# Patient Record
Sex: Female | Born: 1935 | Race: White | Hispanic: No | Marital: Married | Smoking: Former smoker
Health system: Southern US, Community
[De-identification: ages and names within clinical notes are randomized; demographics above are authoritative.]

## PROBLEM LIST (undated history)

## (undated) DIAGNOSIS — J439 Emphysema, unspecified: Secondary | ICD-10-CM

## (undated) DIAGNOSIS — D649 Anemia, unspecified: Secondary | ICD-10-CM

## (undated) DIAGNOSIS — I10 Essential (primary) hypertension: Secondary | ICD-10-CM

## (undated) DIAGNOSIS — I2510 Atherosclerotic heart disease of native coronary artery without angina pectoris: Secondary | ICD-10-CM

## (undated) DIAGNOSIS — K7460 Unspecified cirrhosis of liver: Secondary | ICD-10-CM

## (undated) DIAGNOSIS — I739 Peripheral vascular disease, unspecified: Secondary | ICD-10-CM

## (undated) DIAGNOSIS — H4430 Unspecified degenerative disorder of globe: Secondary | ICD-10-CM

## (undated) DIAGNOSIS — J449 Chronic obstructive pulmonary disease, unspecified: Secondary | ICD-10-CM

## (undated) DIAGNOSIS — H5440 Blindness, one eye, unspecified eye: Secondary | ICD-10-CM

## (undated) HISTORY — PX: OTHER SURGICAL HISTORY: SHX169

## (undated) HISTORY — PX: VASCULAR SURGERY: SHX849

## (undated) HISTORY — PX: KNEE ARTHROSCOPY: SUR90

## (undated) HISTORY — PX: CORONARY STENT PLACEMENT: SHX1402

## (undated) HISTORY — PX: SHOULDER SURGERY: SHX246

---

## 2004-02-08 ENCOUNTER — Emergency Department: Payer: Self-pay | Admitting: Emergency Medicine

## 2004-08-16 ENCOUNTER — Emergency Department: Payer: Self-pay | Admitting: Unknown Physician Specialty

## 2004-08-16 ENCOUNTER — Other Ambulatory Visit: Payer: Self-pay

## 2005-05-15 ENCOUNTER — Ambulatory Visit: Payer: Self-pay | Admitting: Internal Medicine

## 2005-05-30 ENCOUNTER — Other Ambulatory Visit: Payer: Self-pay

## 2005-05-30 ENCOUNTER — Emergency Department: Payer: Self-pay | Admitting: Emergency Medicine

## 2005-07-20 ENCOUNTER — Inpatient Hospital Stay: Payer: Self-pay | Admitting: Internal Medicine

## 2005-07-20 ENCOUNTER — Other Ambulatory Visit: Payer: Self-pay

## 2005-07-25 ENCOUNTER — Other Ambulatory Visit: Payer: Self-pay

## 2005-08-04 ENCOUNTER — Ambulatory Visit: Payer: Self-pay | Admitting: Internal Medicine

## 2005-08-15 ENCOUNTER — Ambulatory Visit: Payer: Self-pay | Admitting: Internal Medicine

## 2005-08-29 ENCOUNTER — Ambulatory Visit: Payer: Self-pay | Admitting: Orthopedic Surgery

## 2005-09-17 ENCOUNTER — Other Ambulatory Visit: Payer: Self-pay

## 2005-09-17 ENCOUNTER — Emergency Department: Payer: Self-pay | Admitting: Emergency Medicine

## 2006-03-13 ENCOUNTER — Ambulatory Visit: Payer: Self-pay

## 2007-01-30 ENCOUNTER — Emergency Department: Payer: Self-pay | Admitting: Emergency Medicine

## 2007-01-30 ENCOUNTER — Other Ambulatory Visit: Payer: Self-pay

## 2007-04-06 ENCOUNTER — Other Ambulatory Visit: Payer: Self-pay

## 2007-04-06 ENCOUNTER — Inpatient Hospital Stay: Payer: Self-pay | Admitting: Internal Medicine

## 2007-05-05 ENCOUNTER — Ambulatory Visit: Payer: Self-pay | Admitting: Internal Medicine

## 2007-06-04 ENCOUNTER — Ambulatory Visit: Payer: Self-pay | Admitting: Internal Medicine

## 2007-07-05 ENCOUNTER — Ambulatory Visit: Payer: Self-pay | Admitting: Internal Medicine

## 2007-08-03 ENCOUNTER — Ambulatory Visit: Payer: Self-pay | Admitting: Internal Medicine

## 2007-08-04 ENCOUNTER — Ambulatory Visit: Payer: Self-pay | Admitting: Internal Medicine

## 2007-09-14 ENCOUNTER — Ambulatory Visit: Payer: Self-pay | Admitting: Internal Medicine

## 2007-10-05 ENCOUNTER — Ambulatory Visit: Payer: Self-pay | Admitting: Internal Medicine

## 2007-11-04 ENCOUNTER — Ambulatory Visit: Payer: Self-pay | Admitting: Internal Medicine

## 2008-03-07 ENCOUNTER — Ambulatory Visit: Payer: Self-pay | Admitting: Internal Medicine

## 2008-07-04 ENCOUNTER — Ambulatory Visit: Payer: Self-pay | Admitting: Specialist

## 2008-09-06 ENCOUNTER — Ambulatory Visit: Payer: Self-pay | Admitting: Internal Medicine

## 2009-04-25 ENCOUNTER — Emergency Department: Payer: Self-pay | Admitting: Emergency Medicine

## 2009-07-31 ENCOUNTER — Ambulatory Visit: Payer: Self-pay | Admitting: Rheumatology

## 2010-05-28 ENCOUNTER — Ambulatory Visit: Payer: Self-pay | Admitting: General Surgery

## 2010-09-24 ENCOUNTER — Ambulatory Visit: Payer: Self-pay | Admitting: Internal Medicine

## 2010-12-31 ENCOUNTER — Emergency Department: Payer: Self-pay | Admitting: Emergency Medicine

## 2011-01-14 ENCOUNTER — Ambulatory Visit: Payer: Self-pay | Admitting: General Surgery

## 2011-08-04 ENCOUNTER — Ambulatory Visit: Payer: Self-pay | Admitting: Internal Medicine

## 2011-08-30 ENCOUNTER — Inpatient Hospital Stay: Payer: Self-pay | Admitting: Internal Medicine

## 2011-08-30 LAB — URINALYSIS, COMPLETE
Bilirubin,UR: NEGATIVE
Hyaline Cast: 9
Ketone: NEGATIVE
Nitrite: NEGATIVE
Ph: 5 (ref 4.5–8.0)
Protein: NEGATIVE
RBC,UR: NONE SEEN /HPF (ref 0–5)
Specific Gravity: 1.008 (ref 1.003–1.030)
Squamous Epithelial: NONE SEEN
WBC UR: NONE SEEN /HPF (ref 0–5)

## 2011-08-30 LAB — COMPREHENSIVE METABOLIC PANEL
Alkaline Phosphatase: 106 U/L (ref 50–136)
Anion Gap: 5 — ABNORMAL LOW (ref 7–16)
Bilirubin,Total: 0.3 mg/dL (ref 0.2–1.0)
Calcium, Total: 9.5 mg/dL (ref 8.5–10.1)
Chloride: 91 mmol/L — ABNORMAL LOW (ref 98–107)
Co2: 39 mmol/L — ABNORMAL HIGH (ref 21–32)
Creatinine: 1 mg/dL (ref 0.60–1.30)
EGFR (African American): >60
EGFR (Non-African Amer.): 53 — ABNORMAL LOW
Glucose: 131 mg/dL — ABNORMAL HIGH (ref 65–99)
Osmolality: 275 (ref 275–301)
SGPT (ALT): 15 U/L
Sodium: 135 mmol/L — ABNORMAL LOW (ref 136–145)

## 2011-08-30 LAB — CBC WITH DIFFERENTIAL/PLATELET
Basophil %: 0.8 %
Eosinophil %: 2 %
HGB: 8.6 g/dL — ABNORMAL LOW (ref 12.0–16.0)
Lymphocyte #: 0.6 10*3/uL — ABNORMAL LOW (ref 1.0–3.6)
MCH: 29.3 pg (ref 26.0–34.0)
Monocyte #: 1 x10 3/mm — ABNORMAL HIGH (ref 0.2–0.9)
Monocyte %: 12.3 %
Neutrophil #: 6 10*3/uL (ref 1.4–6.5)
RBC: 2.94 10*6/uL — ABNORMAL LOW (ref 3.80–5.20)
WBC: 7.8 10*3/uL (ref 3.6–11.0)

## 2011-08-30 LAB — PRO B NATRIURETIC PEPTIDE: B-Type Natriuretic Peptide: 134 pg/mL (ref 0–450)

## 2011-08-30 LAB — TSH: Thyroid Stimulating Horm: 1.6 u[IU]/mL

## 2011-08-31 LAB — CBC WITH DIFFERENTIAL/PLATELET
Eosinophil #: 0 10*3/uL (ref 0.0–0.7)
Eosinophil %: 0.1 %
HCT: 26.8 % — ABNORMAL LOW (ref 35.0–47.0)
Lymphocyte #: 0.3 10*3/uL — ABNORMAL LOW (ref 1.0–3.6)
MCH: 29.9 pg (ref 26.0–34.0)
MCV: 93 fL (ref 80–100)
Monocyte #: 0.1 x10 3/mm — ABNORMAL LOW (ref 0.2–0.9)
Neutrophil #: 7.8 10*3/uL — ABNORMAL HIGH (ref 1.4–6.5)
Platelet: 214 10*3/uL (ref 150–440)
RBC: 2.89 10*6/uL — ABNORMAL LOW (ref 3.80–5.20)
RDW: 14.7 % — ABNORMAL HIGH (ref 11.5–14.5)

## 2011-08-31 LAB — BASIC METABOLIC PANEL
BUN: 19 mg/dL — ABNORMAL HIGH (ref 7–18)
Creatinine: 0.93 mg/dL (ref 0.60–1.30)
EGFR (Non-African Amer.): 58 — ABNORMAL LOW
Glucose: 215 mg/dL — ABNORMAL HIGH (ref 65–99)
Potassium: 3.7 mmol/L (ref 3.5–5.1)
Sodium: 136 mmol/L (ref 136–145)

## 2011-08-31 LAB — CK TOTAL AND CKMB (NOT AT ARMC)
CK, Total: 25 U/L (ref 21–215)
CK, Total: 22 U/L (ref 21–215)
CK-MB: 1.2 ng/mL (ref 0.5–3.6)

## 2011-09-01 LAB — CBC WITH DIFFERENTIAL/PLATELET
Basophil #: 0 10*3/uL (ref 0.0–0.1)
Basophil %: 0.1 %
Eosinophil #: 0 10*3/uL (ref 0.0–0.7)
Eosinophil %: 0 %
HCT: 25.6 % — ABNORMAL LOW (ref 35.0–47.0)
HGB: 8.1 g/dL — ABNORMAL LOW (ref 12.0–16.0)
Lymphocyte #: 0.5 10*3/uL — ABNORMAL LOW (ref 1.0–3.6)
MCH: 28.9 pg (ref 26.0–34.0)
MCV: 91 fL (ref 80–100)
Monocyte %: 2.8 %
Neutrophil %: 93.3 %
Platelet: 238 10*3/uL (ref 150–440)
RBC: 2.81 10*6/uL — ABNORMAL LOW (ref 3.80–5.20)
WBC: 13.4 10*3/uL — ABNORMAL HIGH (ref 3.6–11.0)

## 2011-09-01 LAB — BASIC METABOLIC PANEL
Anion Gap: 8 (ref 7–16)
BUN: 29 mg/dL — ABNORMAL HIGH (ref 7–18)
Creatinine: 0.97 mg/dL (ref 0.60–1.30)
EGFR (African American): >60
EGFR (Non-African Amer.): 55 — ABNORMAL LOW
Glucose: 143 mg/dL — ABNORMAL HIGH (ref 65–99)
Sodium: 136 mmol/L (ref 136–145)

## 2011-09-02 LAB — BASIC METABOLIC PANEL
Anion Gap: 9 (ref 7–16)
Chloride: 91 mmol/L — ABNORMAL LOW (ref 98–107)
Co2: 36 mmol/L — ABNORMAL HIGH (ref 21–32)
Creatinine: 1.03 mg/dL (ref 0.60–1.30)
Glucose: 139 mg/dL — ABNORMAL HIGH (ref 65–99)
Potassium: 4 mmol/L (ref 3.5–5.1)
Sodium: 136 mmol/L (ref 136–145)

## 2011-09-02 LAB — CBC WITH DIFFERENTIAL/PLATELET
Basophil #: 0 10*3/uL (ref 0.0–0.1)
Eosinophil #: 0 10*3/uL (ref 0.0–0.7)
HGB: 8.2 g/dL — ABNORMAL LOW (ref 12.0–16.0)
Lymphocyte #: 0.6 10*3/uL — ABNORMAL LOW (ref 1.0–3.6)
MCH: 28.8 pg (ref 26.0–34.0)
MCHC: 32 g/dL (ref 32.0–36.0)
MCV: 90 fL (ref 80–100)
Monocyte #: 0.5 x10 3/mm (ref 0.2–0.9)
Monocyte %: 4.3 %
Neutrophil %: 90.5 %
Platelet: 229 10*3/uL (ref 150–440)
RBC: 2.83 10*6/uL — ABNORMAL LOW (ref 3.80–5.20)
RDW: 14.4 % (ref 11.5–14.5)

## 2011-09-04 ENCOUNTER — Ambulatory Visit: Payer: Self-pay | Admitting: Internal Medicine

## 2013-09-12 ENCOUNTER — Ambulatory Visit: Payer: Self-pay | Admitting: Gastroenterology

## 2013-10-07 ENCOUNTER — Ambulatory Visit: Payer: Self-pay | Admitting: Gastroenterology

## 2013-10-13 ENCOUNTER — Ambulatory Visit: Payer: Self-pay | Admitting: Gastroenterology

## 2013-11-08 ENCOUNTER — Inpatient Hospital Stay: Payer: Self-pay | Admitting: Internal Medicine

## 2013-11-08 LAB — AMMONIA: Ammonia, Plasma: 14 mcmol/L (ref 11–32)

## 2013-11-08 LAB — URINALYSIS, COMPLETE
BLOOD: NEGATIVE
Bacteria: NONE SEEN
Bilirubin,UR: NEGATIVE
GLUCOSE, UR: NEGATIVE mg/dL (ref 0–75)
Hyaline Cast: 16
Ketone: NEGATIVE
Nitrite: NEGATIVE
PH: 5 (ref 4.5–8.0)
Protein: NEGATIVE
RBC,UR: 5 /HPF (ref 0–5)
Specific Gravity: 1.011 (ref 1.003–1.030)
Squamous Epithelial: 3
WBC UR: 50 /HPF (ref 0–5)

## 2013-11-08 LAB — COMPREHENSIVE METABOLIC PANEL
ALK PHOS: 59 U/L
ALT: 13 U/L — AB
ANION GAP: 4 — AB (ref 7–16)
Albumin: 3.4 g/dL (ref 3.4–5.0)
BILIRUBIN TOTAL: 0.3 mg/dL (ref 0.2–1.0)
BUN: 19 mg/dL — AB (ref 7–18)
CALCIUM: 9.2 mg/dL (ref 8.5–10.1)
Chloride: 93 mmol/L — ABNORMAL LOW (ref 98–107)
Co2: 42 mmol/L (ref 21–32)
Creatinine: 1.05 mg/dL (ref 0.60–1.30)
EGFR (African American): >60
EGFR (Non-African Amer.): 53 — ABNORMAL LOW
Glucose: 143 mg/dL — ABNORMAL HIGH (ref 65–99)
OSMOLALITY: 282 (ref 275–301)
POTASSIUM: 4.4 mmol/L (ref 3.5–5.1)
SGOT(AST): 25 U/L (ref 15–37)
SODIUM: 139 mmol/L (ref 136–145)
TOTAL PROTEIN: 7.1 g/dL (ref 6.4–8.2)

## 2013-11-08 LAB — CBC
HCT: 31 % — ABNORMAL LOW (ref 35.0–47.0)
HGB: 9.6 g/dL — AB (ref 12.0–16.0)
MCH: 30.7 pg (ref 26.0–34.0)
MCHC: 30.8 g/dL — AB (ref 32.0–36.0)
MCV: 100 fL (ref 80–100)
Platelet: 170 10*3/uL (ref 150–440)
RBC: 3.11 10*6/uL — AB (ref 3.80–5.20)
RDW: 15 % — AB (ref 11.5–14.5)
WBC: 10.5 10*3/uL (ref 3.6–11.0)

## 2013-11-08 LAB — TROPONIN I: Troponin-I: <0.02 ng/mL

## 2013-11-08 LAB — HEMOGLOBIN A1C: Hemoglobin A1C: <3.5 % — ABNORMAL LOW (ref 4.2–6.3)

## 2013-11-10 LAB — CBC WITH DIFFERENTIAL/PLATELET
Basophil #: 0 10*3/uL (ref 0.0–0.1)
Basophil %: 0.1 %
EOS ABS: 0 10*3/uL (ref 0.0–0.7)
Eosinophil %: 0 %
HCT: 30 % — ABNORMAL LOW (ref 35.0–47.0)
HGB: 9.4 g/dL — ABNORMAL LOW (ref 12.0–16.0)
Lymphocyte #: 0.4 10*3/uL — ABNORMAL LOW (ref 1.0–3.6)
Lymphocyte %: 2.8 %
MCH: 30.6 pg (ref 26.0–34.0)
MCHC: 31.3 g/dL — AB (ref 32.0–36.0)
MCV: 98 fL (ref 80–100)
MONOS PCT: 1.4 %
Monocyte #: 0.2 x10 3/mm (ref 0.2–0.9)
Neutrophil #: 12.4 10*3/uL — ABNORMAL HIGH (ref 1.4–6.5)
Neutrophil %: 95.7 %
Platelet: 119 10*3/uL — ABNORMAL LOW (ref 150–440)
RBC: 3.07 10*6/uL — AB (ref 3.80–5.20)
RDW: 14.5 % (ref 11.5–14.5)
WBC: 13 10*3/uL — AB (ref 3.6–11.0)

## 2013-11-10 LAB — BASIC METABOLIC PANEL
Anion Gap: 3 — ABNORMAL LOW (ref 7–16)
BUN: 33 mg/dL — AB (ref 7–18)
CALCIUM: 9 mg/dL (ref 8.5–10.1)
CHLORIDE: 93 mmol/L — AB (ref 98–107)
CO2: 42 mmol/L — AB (ref 21–32)
CREATININE: 1.28 mg/dL (ref 0.60–1.30)
EGFR (African American): 51 — ABNORMAL LOW
EGFR (Non-African Amer.): 42 — ABNORMAL LOW
Glucose: 160 mg/dL — ABNORMAL HIGH (ref 65–99)
Osmolality: 286 (ref 275–301)
POTASSIUM: 3.8 mmol/L (ref 3.5–5.1)
Sodium: 138 mmol/L (ref 136–145)

## 2013-11-11 LAB — BASIC METABOLIC PANEL
ANION GAP: 4 — AB (ref 7–16)
BUN: 39 mg/dL — ABNORMAL HIGH (ref 7–18)
CALCIUM: 9.1 mg/dL (ref 8.5–10.1)
CHLORIDE: 95 mmol/L — AB (ref 98–107)
CO2: 43 mmol/L — AB (ref 21–32)
CREATININE: 1.16 mg/dL (ref 0.60–1.30)
EGFR (Non-African Amer.): 47 — ABNORMAL LOW
GFR CALC AF AMER: 57 — AB
Glucose: 123 mg/dL — ABNORMAL HIGH (ref 65–99)
OSMOLALITY: 294 (ref 275–301)
POTASSIUM: 3.5 mmol/L (ref 3.5–5.1)
SODIUM: 142 mmol/L (ref 136–145)

## 2013-11-11 LAB — CBC WITH DIFFERENTIAL/PLATELET
Basophil #: 0 10*3/uL (ref 0.0–0.1)
Basophil %: 0 %
Eosinophil #: 0 10*3/uL (ref 0.0–0.7)
Eosinophil %: 0 %
HCT: 29.6 % — ABNORMAL LOW (ref 35.0–47.0)
HGB: 9.4 g/dL — AB (ref 12.0–16.0)
Lymphocyte #: 0.5 10*3/uL — ABNORMAL LOW (ref 1.0–3.6)
Lymphocyte %: 5.2 %
MCH: 30.9 pg (ref 26.0–34.0)
MCHC: 31.6 g/dL — ABNORMAL LOW (ref 32.0–36.0)
MCV: 98 fL (ref 80–100)
MONOS PCT: 6.3 %
Monocyte #: 0.6 x10 3/mm (ref 0.2–0.9)
Neutrophil #: 8.4 10*3/uL — ABNORMAL HIGH (ref 1.4–6.5)
Neutrophil %: 88.5 %
Platelet: 104 10*3/uL — ABNORMAL LOW (ref 150–440)
RBC: 3.03 10*6/uL — ABNORMAL LOW (ref 3.80–5.20)
RDW: 14 % (ref 11.5–14.5)
WBC: 9.5 10*3/uL (ref 3.6–11.0)

## 2013-11-11 LAB — AMMONIA: Ammonia, Plasma: <10 mcmol/L (ref 11–32)

## 2013-11-12 LAB — CBC WITH DIFFERENTIAL/PLATELET
Basophil #: 0 10*3/uL (ref 0.0–0.1)
Basophil %: 0.1 %
EOS ABS: 0 10*3/uL (ref 0.0–0.7)
Eosinophil %: 0.5 %
HCT: 29.5 % — AB (ref 35.0–47.0)
HGB: 9.2 g/dL — AB (ref 12.0–16.0)
LYMPHS PCT: 12.5 %
Lymphocyte #: 0.9 10*3/uL — ABNORMAL LOW (ref 1.0–3.6)
MCH: 30.8 pg (ref 26.0–34.0)
MCHC: 31.2 g/dL — ABNORMAL LOW (ref 32.0–36.0)
MCV: 99 fL (ref 80–100)
MONO ABS: 1 x10 3/mm — AB (ref 0.2–0.9)
Monocyte %: 13.7 %
NEUTROS ABS: 5.3 10*3/uL (ref 1.4–6.5)
Neutrophil %: 73.2 %
Platelet: 101 10*3/uL — ABNORMAL LOW (ref 150–440)
RBC: 2.99 10*6/uL — ABNORMAL LOW (ref 3.80–5.20)
RDW: 14.3 % (ref 11.5–14.5)
WBC: 7.3 10*3/uL (ref 3.6–11.0)

## 2013-11-12 LAB — BASIC METABOLIC PANEL
Anion Gap: 7 (ref 7–16)
BUN: 46 mg/dL — ABNORMAL HIGH (ref 7–18)
CO2: 41 mmol/L — AB (ref 21–32)
Calcium, Total: 8.8 mg/dL (ref 8.5–10.1)
Chloride: 96 mmol/L — ABNORMAL LOW (ref 98–107)
Creatinine: 1.32 mg/dL — ABNORMAL HIGH (ref 0.60–1.30)
EGFR (African American): 49 — ABNORMAL LOW
EGFR (Non-African Amer.): 41 — ABNORMAL LOW
GLUCOSE: 87 mg/dL (ref 65–99)
Osmolality: 298 (ref 275–301)
Potassium: 3.2 mmol/L — ABNORMAL LOW (ref 3.5–5.1)
SODIUM: 144 mmol/L (ref 136–145)

## 2013-11-13 LAB — BASIC METABOLIC PANEL
Anion Gap: 6 — ABNORMAL LOW (ref 7–16)
BUN: 59 mg/dL — ABNORMAL HIGH (ref 7–18)
CHLORIDE: 98 mmol/L (ref 98–107)
CO2: 38 mmol/L — AB (ref 21–32)
Calcium, Total: 8.4 mg/dL — ABNORMAL LOW (ref 8.5–10.1)
Creatinine: 2.07 mg/dL — ABNORMAL HIGH (ref 0.60–1.30)
EGFR (Non-African Amer.): 24 — ABNORMAL LOW
GFR CALC AF AMER: 29 — AB
GLUCOSE: 106 mg/dL — AB (ref 65–99)
Osmolality: 300 (ref 275–301)
POTASSIUM: 4 mmol/L (ref 3.5–5.1)
SODIUM: 142 mmol/L (ref 136–145)

## 2013-11-13 LAB — CULTURE, BLOOD (SINGLE)

## 2013-11-14 LAB — BASIC METABOLIC PANEL
ANION GAP: 6 — AB (ref 7–16)
BUN: 63 mg/dL — ABNORMAL HIGH (ref 7–18)
Calcium, Total: 7.8 mg/dL — ABNORMAL LOW (ref 8.5–10.1)
Chloride: 101 mmol/L (ref 98–107)
Co2: 35 mmol/L — ABNORMAL HIGH (ref 21–32)
Creatinine: 2.39 mg/dL — ABNORMAL HIGH (ref 0.60–1.30)
EGFR (Non-African Amer.): 21 — ABNORMAL LOW
GFR CALC AF AMER: 25 — AB
Glucose: 121 mg/dL — ABNORMAL HIGH (ref 65–99)
OSMOLALITY: 302 (ref 275–301)
POTASSIUM: 3.6 mmol/L (ref 3.5–5.1)
Sodium: 142 mmol/L (ref 136–145)

## 2013-11-14 LAB — CULTURE, BLOOD (SINGLE)

## 2013-11-15 LAB — BASIC METABOLIC PANEL
ANION GAP: 5 — AB (ref 7–16)
BUN: 44 mg/dL — AB (ref 7–18)
CALCIUM: 8.1 mg/dL — AB (ref 8.5–10.1)
CO2: 33 mmol/L — AB (ref 21–32)
CREATININE: 1.5 mg/dL — AB (ref 0.60–1.30)
Chloride: 105 mmol/L (ref 98–107)
GFR CALC AF AMER: 43 — AB
GFR CALC NON AF AMER: 35 — AB
Glucose: 113 mg/dL — ABNORMAL HIGH (ref 65–99)
OSMOLALITY: 297 (ref 275–301)
Potassium: 3.4 mmol/L — ABNORMAL LOW (ref 3.5–5.1)
Sodium: 143 mmol/L (ref 136–145)

## 2013-11-15 LAB — CBC WITH DIFFERENTIAL/PLATELET
Basophil #: 0 10*3/uL (ref 0.0–0.1)
Basophil %: 0.1 %
Eosinophil #: 0.1 10*3/uL (ref 0.0–0.7)
Eosinophil %: 2 %
HCT: 22.6 % — AB (ref 35.0–47.0)
HGB: 6.7 g/dL — AB (ref 12.0–16.0)
LYMPHS ABS: 0.6 10*3/uL — AB (ref 1.0–3.6)
LYMPHS PCT: 11.1 %
MCH: 29.3 pg (ref 26.0–34.0)
MCHC: 29.8 g/dL — ABNORMAL LOW (ref 32.0–36.0)
MCV: 98 fL (ref 80–100)
MONOS PCT: 12.3 %
Monocyte #: 0.6 x10 3/mm (ref 0.2–0.9)
Neutrophil #: 3.8 10*3/uL (ref 1.4–6.5)
Neutrophil %: 74.5 %
Platelet: 82 10*3/uL — ABNORMAL LOW (ref 150–440)
RBC: 2.3 10*6/uL — AB (ref 3.80–5.20)
RDW: 14.6 % — ABNORMAL HIGH (ref 11.5–14.5)
WBC: 5.1 10*3/uL (ref 3.6–11.0)

## 2013-11-22 ENCOUNTER — Observation Stay: Payer: Self-pay | Admitting: Internal Medicine

## 2014-05-23 NOTE — H&P (Signed)
PATIENT NAME:  Kari Smith, Kari Smith MR#:  [id number] DATE OF BIRTH:  1928/01/11  DATE OF ADMISSION:  08/30/2011  PRIMARY CARE PHYSICIAN: Kari ReichmannVishwanath Hande, MD   CHIEF COMPLAINT: Altered mental status.   HISTORY OF PRESENT ILLNESS: Ms. Kari CreteWells is an 81 year old white female with a history of advanced chronic obstructive pulmonary disease, O2 requiring for many years, peripheral vascular disease with history of left carotid endarterectomy and nonoperable right carotid stenosis by recent CT, iron deficiency anemia treated in the past with transfusion secondary to gastric erosions, who presents to the ER with a one-week history of increased somnolence. The patient lives with her daughter, who is not present; but the other daughter is here and provides a history that for the last week she has been doing nothing but sleeping. She has had very little appetite, has been eating a lot of ice. She was recently diagnosed with recurrent anemia and has been on iron twice daily for the last several months. Per daughter, the last time she had a transfusion was five years ago. The daughter states that she was seen by Dr. Marcello FennelHande recently and her hemoglobin has continued to drop despite taking the iron twice daily. She has chronic pain due to multiple arthritides and is not considered a candidate for any surgeries due to her severe chronic obstructive pulmonary disease. She states that she has been doing some coughing and has been short of breath, worse in the morning, with productive cough in the morning. However, her daughter states that she does not appear to be choking on her food. She does cough more after eating. She has had early satiety with weight loss. She is quite miserable and has stated several times, including today, that she is tired and ready to die if God will take her. She does have a Home Care nurse who provides care for four hours daily. She does have a DO NOT RESUSCITATE order at home.    PAST MEDICAL HISTORY:   1. Chronic obstructive pulmonary disease, advanced, with oxygen use 24/7 for many years.  2. Iron deficiency anemia with history of GI bleed in 2009 secondary to erosive gastropathy requiring transfusion, currently on iron oral.  3. Hypertension.  4. Macular degeneration.  5. Peripheral vascular disease, status post left internal carotid artery carotid endarterectomy several years ago.  6. Gout.  7. History of possible cirrhosis, possibly secondary to NASH.   8. Osteoarthritis with multiple painful joints.   MEDICATION LIST:  1. Advair Diskus 250/50, 1 puff twice daily.  2. Allopurinol 100 mg daily.  3. Aspirin 81 mg daily.  4. Atenolol 25 mg daily.  5. Colchicine 0.6 mg daily. 6. Combivent 1 puff 3 times daily.  7. DuoNeb one vial via nebulizer 4 times daily. 8. Fish oil 1 cap daily. 9. Fosamax 1 tablet weekly on Fridays.  10. Furosemide 80 mg daily. 11. Lisinopril 20 mg daily.  12. Mucinex 600 mg daily.  13. Omeprazole 20 mg daily.  14. Potassium chloride 10 mEq daily.  15. Simvastatin 20 mg daily.   PAST SURGICAL HISTORY:  1. Abdominal hysterectomy. 2. Left carotid endarterectomy. 3. Left knee arthroscopy.   ALLERGIES: Penicillin.    LAST HOSPITALIZATION: In 2009 for symptomatic iron deficiency anemia secondary to GI bleed.   FAMILY HISTORY: Her mother died at 8665 of metastatic cancer of unknown primary. Father's history is unknown. She has a sister who died of lung cancer.   SOCIAL HISTORY: She lives with one of her daughters. She is an  ex-smoker, having quit many years ago. She does not drink.   REVIEW OF SYSTEMS: Positive for weight loss and fatigue. The patient denies any changes in vision. She denies any hearing loss or seasonal rhinitis. She does report cough and wheeze with dyspnea on minimal exertion. She does have a history of chronic obstructive pulmonary disease. She denies chest pain and orthopnea. She does have dyspnea with exertion as per history of present  illness. No recent nausea, vomiting, diarrhea, or abdominal pain. She has noted some constipation and dark stools. She has had early satiety and anorexia. She has no history of easy bruising or bleeding but does have a history of anemia. She has chronic arthritis pain in her neck, back, shoulders, knees, and hips. She does have a history of gout with a recent flare treated last week. She denies any history of numbness and dysarthria. No history of CVAs or transient ischemic attacks, but she does have peripheral vascular disease. No history of anxiety or insomnia.   PHYSICAL EXAMINATION:  GENERAL: This is an elderly frail-appearing woman in mild respiratory distress, currently on BiPAP.   VITAL SIGNS: Initial vital signs in the ER: Blood pressure 140/94, respirations 31, pulse 81, temperature 98.9. Room air pulse oximetry 71% per family at home. Here, room air pulse oximetry is not known.   HEENT: Pupils are equal, round, and reactive to light. Extraocular movements are intact. Oropharynx is difficult to examine as she is on BiPAP currently. She does have difficulty hearing.   NECK: Supple without any lymphadenopathy, thyromegaly or carotid bruits. She has a left CEA scar.   LUNGS: Notable for diminished breath sounds anteriorly with wheezing in all lung fields and increased work of breathing. The patient is using accessory muscles.   CARDIOVASCULAR: Regular rate and rhythm. No murmurs, rubs, or gallops.   ABDOMEN: Soft, nontender, and nondistended with good bowel sounds and no evidence of hepatosplenomegaly.   EXTREMITIES: She is moving all four extremities.   SKIN: Warm and dry. There are multiple bruises on her lower extremities but no edema.   LYMPH: No cervical, axillary, inguinal, or supraclavicular lymphadenopathy.   NEUROLOGICAL: Exam is grossly nonfocal.  PSYCHIATRIC: She is alert and oriented to person, place, and time.   ADMISSION LABORATORY DATA: Sodium 135, potassium 4.3,  chloride 91, bicarbonate 39, BUN 21, creatinine 1.0, glucose 131. Hemoglobin is 8.6, white count 7.8, platelets are 225. MCV is 94. Troponin I is 0.02. TSH is 1.6. Portable chest x-ray shows mild interstitial edema with mild hyperinflation. Prior hemoglobin from [facility] dated July 16th was 8.3. Platelets at that time were 204, creatinine at that time was 0.9. Bicarbonate at that time was 38.9.   ASSESSMENT AND PLAN:  1. Acute on chronic hypercarbic respiratory failure secondary to chronic obstructive pulmonary disease exacerbation with wheezing on exam: We will admit for a trial of BiPAP and IV steroids. She is DO NOT RESUSCITATE and does not want to be intubated. We will admit her to the Critical Care Unit for management of respiratory failure noninvasively. Hospice palliative care has been ordered.  2. New onset anemia: The patient's hemoglobin appears to be stable per review of [facility] labs from two weeks ago. She has a heme positive stool here on exam. She does have a history of erosive gastropathy by 2009 EGD with a normal colonoscopy at that time. She did require transfusions at that time. Currently since she does not appear to be losing blood precipitously, she does not meet criteria  for transfusion. We will check an EPO level and consider erythropoietin and IV iron. Given her acute respiratory status, she is not a candidate for EGD or colonoscopy at this time.   CODE STATUS: Again, I have discussed end-of-life issues with her daughter who has reaffirmed that the patient is DO NOT RESUSCITATE and would not want any aggressive measures to be taken at this time.   ESTIMATED TIME OF CARE: 45 minutes  ____________________________ Duncan Dull, MD tt:cbb D: 08/30/2011 19:22:21 ET T: 08/31/2011 08:24:08 ET JOB#: [id number]  cc: Duncan Dull, MD, <Dictator> Kari Reichmann, MD Duncan Dull MD ELECTRONICALLY SIGNED 08/31/2011 15:35

## 2014-05-23 NOTE — Discharge Summary (Signed)
PATIENT NAME:  Kari Smith, Mamta E MR#:  [id number] DATE OF BIRTH:  [birth year]  DATE OF ADMISSION:  08/30/2011 DATE OF DISCHARGE:  09/02/2011  DIAGNOSES AT TIME OF DISCHARGE:  1. Acute on chronic hypercapnic respiratory failure secondary to chronic obstructive pulmonary disease.  2. Anemia.  3. Hypertension.  4. Hyperlipidemia.  5. History of gout.  6. Osteoarthritis.  7. Generalized weakness.   CHIEF COMPLAINT: Altered mental status.   HISTORY OF PRESENT ILLNESS: Kari Smith is an 81 year old female with a history of advanced COPD requiring oxygen, history of peripheral vascular disease, previous history of left carotid endarterectomy, right carotid stenosis who reportedly had been sleeping a lot and also has been having poor appetite for the last few weeks. The patient also had been complaining of pain from her arthritis and reportedly has been feeling tired easily.   PAST MEDICAL HISTORY:  1. Chronic obstructive pulmonary disease.  2. Iron deficiency anemia with a history of GI bleed in 2009 due to erosive gastropathy.  3. History of hypertension.  4. Macular degeneration.  5. Peripheral vascular disease status post left carotid endarterectomy. 6. History of gout. 7. History of possible cirrhosis secondary to NASH. 8. History of osteoarthritis. 9. Gout.   PHYSICAL EXAMINATION: The patient was in mild distress, initially placed on BiPAP. Blood pressure 140/94, respirations 31, pulse 81, temperature 98.9, pulse oximetry 71% on room air as per family. Pupils equal and reactive to light. NECK supple. No JVD. LUNGS decreased air entry with bilateral inspiratory and expiratory wheezes and increased work of breathing. HEART S1, S2. ABDOMEN is soft, nontender. EXTREMITIES no edema. NEUROLOGIC: Nonfocal.   LABORATORY, DIAGNOSTIC, AND RADIOLOGICAL DATA: Sodium 135, potassium 4.3, chloride 91, bicarb 39, BUN 21, creatinine 1.0, glucose 131, hemoglobin 8.6, WBC count 7.8, platelets 225, MCV  94. Troponin-I 0.02. TSH 1.6.   Chest x-ray showed mild interstitial edema with hyperinflation.   HOSPITAL COURSE: During her stay in the hospital the patient also received nebulized bronchodilator therapy and also intravenous Solu-Medrol and Zithromax. She was seen in consultation by Palliative Care; however, she clinically gradually improved and stabilized from a respiratory standpoint. Her Solu-Medrol was decreased to 20 q.8 and subsequently switched to p.o. prednisone. Portable chest x-ray showed mild interstitial edema secondary to CHF. No focal pneumonia was seen. Blood gas in ABG showed pH 7.39, pCO2 73, pO2 70, bicarb 44.2, oxygen sat 97%. Erythropoietin level is high at 73. She continued to be anemic but stable. Hemoglobin was 8.2. WBC count on discharge was 12. Platelets were 229. She was ambulated, seen by physical therapy. Discussions were held with the family and it was felt that Hospice could be arranged for her at home. She was discharged in stable condition on the following medications.   DISCHARGE MEDICATIONS:  1. Prednisone 40 mg a day for three days, decrease by 10 mg every three days until gone.  2. Zithromax 250 mg p.o. daily for three days.  3. Oxygen 3 liters nasal cannula. 4. Advair Diskus 250/50 1 puff b.i.d.   5. Allopurinol 100 mg once a day.  6. Atenolol 25 mg once a day.  7. Colchicine 0.6 mg once a day.  8. Combivent Respimat 1 puff t.i.d. p.r.n.  9. DuoNeb one vial via nebulizer q.i.d. p.r.n.  10. Fosamax 70 mg once a week.  11. Furosemide 80 mg once a day.  12. Lisinopril 20 mg a day.  13. Simvastatin 20 mg a day.  14. Aspirin 81 mg a day.  15. Fish  Oil capsule once capsule a day.   16. Omeprazole 20 mg a day. 17. Mucinex 600 mg once a day.  18. KCl 10 mEq once a day.   FOLLOW-UP: The patient will follow-up with me, Dr. Marcello Fennel, in 1 to 2 weeks' time. The family has been advised to call back if there are any questions or concerns.    ____________________________ Barbette Reichmann, MD vh:drc D: 09/03/2011 13:18:24 ET T: 09/03/2011 15:41:12 ET JOB#: [id number]  cc: Barbette Reichmann, MD, <Dictator> Barbette Reichmann MD ELECTRONICALLY SIGNED 09/10/2011 17:10

## 2014-05-27 NOTE — Consult Note (Signed)
Brief Consult Note: Diagnosis: delirium multifactorial.   Patient was seen by consultant.   Orders entered.   Comments: Psychiatry: PAtient seen. Interviewed daughter. REviewed chart. Patient presented with resiratory problems, confusion. Daughter reports that for the last 24-48 hours patient has been very confused. Having visual hallucinations and not knowing where she was. Daughter also reports patient has not slept properly in days. Currently patient is asleep for the first time, daughter says, in a day.  Patient not able to give ROS. Asleep. Would not be clinically helpful to awaken her.  Patient is both acutely and chronically ill woman. Sleeping peacefully.   No past history of any mental health problems. No psychiatric history. Lives with daughter and son-in-law. Normally no behavior complaints.  Labs show persistant hypercapnia, UTI, nml ammonia.  Delirium. Multiple likely causes. Respiratory failure, UTI, Hospitalization.  Agree with prn haldol. Will switch out qhs seroquel for risperdal 0.5 mg. Better tolerated and usually better with delirium hallucinations.  Brief psycho-ed with daughter.   Will follow up.  Electronic Signatures: Audery Amellapacs, Derian Pfost T (MD)  (Signed 09-Oct-15 17:46)  Authored: Brief Consult Note   Last Updated: 09-Oct-15 17:46 by Audery Amellapacs, Rashun Grattan T (MD)

## 2014-05-27 NOTE — H&P (Signed)
PATIENT NAME:  Kari Smith, Kari Smith MR#:  [id number] DATE OF BIRTH:  05-14-1927  PRIMARY CARE PHYSICIAN: Barbette Reichmann, MD, with [facility] REFERRING EMERGENCY ROOM PHYSICIAN: Coolidge Breeze, MD  CHIEF COMPLAINT: Weakness and confusion.   HISTORY OF PRESENT ILLNESS: This very pleasant 81 year old woman is brought in today by her 2 daughters directly from her primary care physician's office. The daughters report that over the past 2 weeks she has been having increasing confusion and weakness. She has also had increasing shortness of breath. Today at her primary care office, the daughters report that the patient had an episode of uncontrollable shaking and was advised to proceed to the Emergency Room.   She has a past medical history of chronic COPD on 3 liters of oxygen chronically at home. She has not had any recent fevers or chills. She has not had an increase in her chronic cough and sputum production. No wheezing or chest pain noted. She has not had any nausea, vomiting, or diarrhea, but she has had a decreased appetite. The daughters report that about one month ago the patient had an EGD for dysphagia, which revealed esophageal varices. After the EGD her beta blocker was changed from atenolol to nadolol. Her daughter Zella Ball feels that the current symptoms started at the time of that medication change. She was only on nadolol for 3 days after which Robin changed it back to atenolol.    PAST MEDICAL HISTORY: 1.  COPD on 3 liters nasal oxygen at home.  2.  Cirrhosis with grade 2 esophageal varices diagnosed in September 2015.  3.  Macular degeneration with legal blindness.  4.  Hard of hearing.  5.  Chronic iron deficiency anemia with a history of GI in 2009 secondary to erosive gastropathy.  6.  Hypertension.  7.  Peripheral vascular disease, status post left internal artery carotid endarterectomy.  8.  Gout.  9.  Osteoarthritis.   SOCIAL HISTORY: The patient lives with her daughter, Abundio Miu, and her son-in-law. She is a former smoker, quit 21 years ago. She does not drink alcohol, has never consumed alcohol. She does not use any illicit substances. She uses a walker at all times for mobility. She uses oxygen at 3 liters chronically.   FAMILY HISTORY: Negative for coronary artery disease or stroke. Positive for lung cancer in her brother and sister. Her mother also died of cancer of unknown primary. Her daughter recently passed due to esophageal and oral cancer.   ALLERGIES: PENICILLIN.   HOME MEDICATIONS: 1.  Vitamin C 500 mg 1 tablet once a day.  2.  Vitamin B12 1000 mg 1 tablet daily.  3.  Simvastatin 20 mg 1 tablet daily.  4.  Potassium chloride 10 mEq 1 tablet twice a day.  5.  Omeprazole 20 mg 1 capsule twice a day.  6.  Lisinopril 10 mg once a day.  7.  Furosemide 80 mg, 0.5 tablets once a day.  8.  Ferrous sulfate 325 mg 1 tablet twice a day.  9.  DuoNeb 0.5 mg/2.5 mg/3 mL inhalation solution 3 mL inhaled every 6 hours as needed for shortness of breath.  10.  Combivent CFC free, 20 mcg-100 mcg inhaler, 1 puff inhaled every 6 hours.  11.  Biofreeze applied topically to affected area once a day at bedtime.  12.  Atenolol 25 mg 1 tablet once a day.  13.  Aspirin 81 mg 1 tablet once a day.  14.  Aspercreme 10% topical cream, apply to  affected area once a day at bedtime.  15.  Allopurinol 100 mg 1 tablet once a day.  16.  Advair Diskus 250 mcg-50 mcg inhalation powder, 1 puff inhaled twice a day.  17.  Oxygen 3 liters chronically.  REVIEW OF SYSTEMS: Unable to obtain as the patient is on BiPAP.   PHYSICAL EXAMINATION: VITAL SIGNS: Temperature 98.6, pulse 65, respirations 20, blood pressure 138/65, oxygenation 95% on BiPAP.  GENERAL: No acute distress. Tolerating BiPAP well. Appears fatigued.  HEENT: Pupils are equal, round, and reactive, conjunctivae are clear. Oral mucous membranes are dry.  Oropharynx is clear. NECK: Supple. No cervical lymphadenopathy.    RESPIRATORY: Coarse breath sounds on BiPAP, no wheezing or rhonchi noted.  CARDIOVASCULAR: Regular rate and rhythm, no murmurs, rubs, or gallops. Distant heart sounds.  ABDOMEN: Soft, nontender, bowel sounds are normal. No guarding, no rebound.  EXTREMITIES: No edema. Peripheral pulses are 1+, lower extremities are very sensitive to palpation.  SKIN: Ecchymosis over the left ankle, non-indurated, otherwise skin is dry. No rash noted.  MUSCULOSKELETAL: Range of motion is normal. She is able to move all 4 extremities. Strength is 4 out of 4 throughout.  NEUROLOGIC: Cranial nerves II through XII are grossly intact. Strength and sensation intact, nonfocal neurologic examination.  PSYCHIATRIC: Unable to assess as she is on BiPAP and unable to answer questions at this time. She seems calm.   LABORATORY DATA: Sodium 139, potassium 4.4, chloride 93, bicarbonate 42, BUN 19, creatinine 1.05, glucose 143, ammonia 14.  LFTs are normal with the exception of a low ALT at 13. Troponin negative at less than 0.02, hemoglobin 9.6, platelets 170,000, white blood cells 10.5, MCV is 100. ABG, pH of 7.3, pCO2 of 92, pO2 of 113.  IMAGING: Chest x-ray shows no pulmonary edema. There is cardiomegaly. There is streaky mid basilar atelectasis or infiltrate. Mid thoracic dextroscoliosis.   ASSESSMENT AND PLAN: 1.  Acute respiratory failure with hypercapnia and hypoxia: This is likely due to chronic obstructive pulmonary disease exacerbation. She is currently on BiPAP and tolerating it well. Start nebulizer treatments q. 6 hours. She has received steroids in the Emergency Room, 125 mg of Solu-Medrol, will continue with 60 mg q. 6 hours. Levaquin was initiated in the Emergency Room, will continue 750 mg daily. Currently holding beta blocker in the setting of acute chronic obstructive pulmonary disease exacerbation. The patient is DO NOT RESUSCITATE/DO NOT INTUBATE. Will attempt to transition to high flow nasal cannula as soon  as possible for comfort.  2.  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  The diagnosis of esophageal varices is relatively new, made last month. She has had changes in her beta blocker in response to the new diagnosis. We will need to discuss with family and gastroenterology which beta blocker is most appropriate for her upon discharge. The family feels that nadolol precipitated her current symptoms. However, she has not taken nadolol for over 2 weeks.  3.  Chronic iron deficiency anemia: Hemoglobin is stable relative to prior values. There are no signs of active bleeding. Monitor closely in light of #2. Holding heparin at this time.  4.  Hyperlipidemia: Continue statin.  5.  Hypertension: Continue lisinopril and furosemide.  6.  Gout: Continue allopurinol.   TIME SPENT ON ADMISSION: Forty minutes.   ____________________________ Ena Dawleyatherine P. Clent RidgesWalsh, MD cpw:LT D: 11/08/2013 19:24:02 ET T: 11/08/2013 20:10:40 ET JOB#: [id number]  cc: Santina Evansatherine P. Clent RidgesWalsh, MD, <  Dictator> Gale Journey MD ELECTRONICALLY SIGNED 11/09/2013 13:36

## 2014-05-27 NOTE — Consult Note (Signed)
Psychiatry: Follow-up note for this woman with delirium secondary to medical problems.  History today again obtained from her daughter and review of the chart.  Daughter reports that the patient slept well last night.  Has been able to wake of a little bit go to the bathroom but goes back to sleep.  Daughter thinks this is a very satisfactory situation as her mother had not been sleeping for days before.  She said when her mother has been up she has not appeared to be hallucinating or talking out of her head.  No sign of any side effects. of systems is not obtainable directly from the patient.  Family indicates no new complaints. status: Patient was sound asleep.  I did not attempt to wake her up. Low dose of Risperdal substituted for Seroquel patient slept well last night no sign of active agitation or delirium right now.  No change indicated to medicine.  Continue to use haloperidol or when necessary low-dose Risperdal during the day if necessary.  I will continue to follow up. Delirium due to medical condition total time spent 15 minutes  Electronic Signatures: Danialle Dement, Jackquline DenmarkJohn T (MD)  (Signed on 10-Oct-15 19:48)  Authored  Last Updated: 10-Oct-15 19:48 by Audery Amellapacs, Dacota Ruben T (MD)

## 2014-05-27 NOTE — Discharge Summary (Signed)
PATIENT NAME:  Kari Smith, Braden E MR#:  [id number] DATE OF BIRTH:  10-04-1927  DATE OF ADMISSION:  11/08/2013 DATE OF DISCHARGE:  11/15/2013  DIAGNOSES AT TIME OF DISCHARGE:   1. Acute on chronic respiratory failure with hypercapnia and hypoxemia secondary to chronic obstructive pulmonary disease exacerbation.  2. Cirrhosis of liver.  3. Altered mental status with confusion and encephalopathy secondary to chronic obstructive pulmonary disease and hypercapnia.  4. Chronic iron deficiency anemia.  5. Hypertension.  6. Acute renal failure.  7. Confusion and agitation most likely secondary to medications.   CHIEF COMPLAINT: Weakness and confusion.   HISTORY OF PRESENT ILLNESS: Kari Smith is an 81 year old female who initially presented to my office with increasing confusion, weakness, and worsening shortness of breath. The patient was also noted to have an episode of shaking and she became cyanotic, was subsequently sent to the Emergency Room and was noted to be hypercapnic with a pCO2 of 92 and pO2 of 113 on her blood gases. Past medical history is significant for COPD normally on 3 liters nasal cannula, she also was recently diagnosed with cirrhosis of the liver after an EGD showed evidence of esophageal varices and according to family her symptoms had gotten worse after her medications were changed from atenolol to nadolol, the patient reportedly had been on nadolol for about 3 days prior to admission.   PAST MEDICAL HISTORY: Significant for COPD on 3 liters nasal cannula, cirrhosis with grade 2 varices diagnosed in September of 2015, macular degeneration with legal blindness, history of iron-deficiency anemia, hypertension, PAD, gout, osteoarthritis, hard of hearing. Please see H and P for other details.    HOSPITAL COURSE: The patient was noted to be hypercapnic on admission with ABG showed pH of 7.3, pCO2 of 92, pO2 of 113. Sodium was 139, potassium 4.4, chloride 93, bicarbonate 42, creatinine of  1.05, glucose 140, ammonia of 14. LFTs were normal with a low ALT of 13. Troponin less than 0.02. Hemoglobin 9.6, platelets 170,000. A chest x-ray showed no evidence of pulmonary edema. There was evidence of cardiomegaly. There was streaky mid basilar atelectasis and mild thoracic dextroscoliosis. The patient [facility] and was started on Solu-Medrol. She also was placed on Levaquin and beta blockers were held, however the patient became confused and agitated and her steroids with therefore discontinued. She was seen by psychiatrist and she was also started Risperdal 0.5 mg p.o. at bedtime which appeared to help her and she managed to rest well after that. The patient also did have acute renal failure with creatinine going from 1.05 to 2.39, with IV fluids creatinine subsequently came back down to 1.5. She was also noted to be anemic, part of this was probably dilutional. She clinically improved and she was discharged in stable condition on the following medications.   DISCHARGE MEDICATIONS:   1.  Allopurinol 100 mg once a day.   2.  Atenolol 25 mg once a day.  3.  Simvastatin 20 mg once a day.  4.  Aspirin 81 mg a day.  5.  Advair Diskus 250/50 one puff b.i.d.  6.  Combivent Respimat 1 puff q. 6 hours p.r.n.  7.  DuoNebs 3 ml q. 6 p.r.n.  8.  Omeprazole 20 mg b.i.d.  9.  Vitamin B12 1000 mcg once a day.  10.  Ferrous sulfate 325 mg b.i.d.  11.  Aspercreme topically to the affected area once a day at bedtime.   12.  Lisinopril 5 mg once a  day.   13.  Levaquin 250 mg daily for 1 more week.   14.  Risperdal 0.5 mg at bedtime.   15.  Nystatin powder 1 application t.i.d.     Furosemide was held and potassium was also held and she was advised to have a repeat CBC and metabolic panel in 1 week. Advised to continue oxygen at 3 liters nasal cannula and to follow with me, Dr. Marcello Fennel, in 1-2 weeks time. The patient was stable at the time of discharge.   TOTAL TIME SPENT IN DISCHARGING THE  PATIENT: 35 minutes.    ____________________________ Barbette Reichmann, MD vh:bu D: 11/15/2013 12:44:08 ET T: 11/15/2013 13:15:09 ET JOB#: [id number]  cc: Barbette Reichmann, MD, <Dictator> Barbette Reichmann MD ELECTRONICALLY SIGNED 11/29/2013 16:02

## 2015-01-12 ENCOUNTER — Emergency Department (HOSPITAL_COMMUNITY): Payer: Medicare Other

## 2015-01-12 ENCOUNTER — Encounter (HOSPITAL_COMMUNITY): Payer: Self-pay

## 2015-01-12 ENCOUNTER — Emergency Department (HOSPITAL_COMMUNITY)
Admission: EM | Admit: 2015-01-12 | Discharge: 2015-01-12 | Disposition: A | Discharge status: Home / Self Care | Payer: Medicare Other | Attending: Emergency Medicine | Admitting: Emergency Medicine

## 2015-01-12 DIAGNOSIS — Z8719 Personal history of other diseases of the digestive system: Secondary | ICD-10-CM | POA: Insufficient documentation

## 2015-01-12 DIAGNOSIS — H5440 Blindness, one eye, unspecified eye: Secondary | ICD-10-CM | POA: Insufficient documentation

## 2015-01-12 DIAGNOSIS — J439 Emphysema, unspecified: Secondary | ICD-10-CM | POA: Diagnosis not present

## 2015-01-12 DIAGNOSIS — Y998 Other external cause status: Secondary | ICD-10-CM | POA: Insufficient documentation

## 2015-01-12 DIAGNOSIS — Y9389 Activity, other specified: Secondary | ICD-10-CM | POA: Insufficient documentation

## 2015-01-12 DIAGNOSIS — S0083XA Contusion of other part of head, initial encounter: Secondary | ICD-10-CM | POA: Insufficient documentation

## 2015-01-12 DIAGNOSIS — Z79899 Other long term (current) drug therapy: Secondary | ICD-10-CM | POA: Diagnosis not present

## 2015-01-12 DIAGNOSIS — I2510 Atherosclerotic heart disease of native coronary artery without angina pectoris: Secondary | ICD-10-CM | POA: Diagnosis not present

## 2015-01-12 DIAGNOSIS — I10 Essential (primary) hypertension: Secondary | ICD-10-CM | POA: Insufficient documentation

## 2015-01-12 DIAGNOSIS — S0990XA Unspecified injury of head, initial encounter: Secondary | ICD-10-CM | POA: Diagnosis present

## 2015-01-12 DIAGNOSIS — Y92002 Bathroom of unspecified non-institutional (private) residence single-family (private) house as the place of occurrence of the external cause: Secondary | ICD-10-CM | POA: Diagnosis not present

## 2015-01-12 DIAGNOSIS — S8002XA Contusion of left knee, initial encounter: Secondary | ICD-10-CM | POA: Insufficient documentation

## 2015-01-12 DIAGNOSIS — S81012A Laceration without foreign body, left knee, initial encounter: Secondary | ICD-10-CM | POA: Diagnosis not present

## 2015-01-12 DIAGNOSIS — Z7982 Long term (current) use of aspirin: Secondary | ICD-10-CM | POA: Insufficient documentation

## 2015-01-12 DIAGNOSIS — W19XXXA Unspecified fall, initial encounter: Secondary | ICD-10-CM

## 2015-01-12 DIAGNOSIS — E669 Obesity, unspecified: Secondary | ICD-10-CM | POA: Insufficient documentation

## 2015-01-12 DIAGNOSIS — Z9981 Dependence on supplemental oxygen: Secondary | ICD-10-CM | POA: Diagnosis not present

## 2015-01-12 DIAGNOSIS — Z7951 Long term (current) use of inhaled steroids: Secondary | ICD-10-CM | POA: Insufficient documentation

## 2015-01-12 DIAGNOSIS — W01198A Fall on same level from slipping, tripping and stumbling with subsequent striking against other object, initial encounter: Secondary | ICD-10-CM | POA: Insufficient documentation

## 2015-01-12 DIAGNOSIS — D649 Anemia, unspecified: Secondary | ICD-10-CM | POA: Diagnosis not present

## 2015-01-12 DIAGNOSIS — T148XXA Other injury of unspecified body region, initial encounter: Secondary | ICD-10-CM

## 2015-01-12 DIAGNOSIS — Z87891 Personal history of nicotine dependence: Secondary | ICD-10-CM | POA: Insufficient documentation

## 2015-01-12 DIAGNOSIS — J159 Unspecified bacterial pneumonia: Secondary | ICD-10-CM | POA: Insufficient documentation

## 2015-01-12 DIAGNOSIS — Z880 Allergy status to penicillin: Secondary | ICD-10-CM | POA: Diagnosis not present

## 2015-01-12 DIAGNOSIS — J189 Pneumonia, unspecified organism: Secondary | ICD-10-CM

## 2015-01-12 HISTORY — DX: Essential (primary) hypertension: I10

## 2015-01-12 HISTORY — DX: Blindness, one eye, unspecified eye: H54.40

## 2015-01-12 HISTORY — DX: Emphysema, unspecified: J43.9

## 2015-01-12 HISTORY — DX: Chronic obstructive pulmonary disease, unspecified: J44.9

## 2015-01-12 HISTORY — DX: Unspecified cirrhosis of liver: K74.60

## 2015-01-12 HISTORY — DX: Atherosclerotic heart disease of native coronary artery without angina pectoris: I25.10

## 2015-01-12 HISTORY — DX: Unspecified degenerative disorder of globe: H44.30

## 2015-01-12 LAB — CBC WITH DIFFERENTIAL/PLATELET
BASOS ABS: 0 10*3/uL (ref 0.0–0.1)
BASOS PCT: 0 %
EOS PCT: 2 %
Eosinophils Absolute: 0.3 10*3/uL (ref 0.0–0.7)
HCT: 31.6 % — ABNORMAL LOW (ref 36.0–46.0)
Hemoglobin: 8.5 g/dL — ABNORMAL LOW (ref 12.0–15.0)
Lymphocytes Relative: 30 %
Lymphs Abs: 3.8 10*3/uL (ref 0.7–4.0)
MCH: 29.5 pg (ref 26.0–34.0)
MCHC: 26.9 g/dL — ABNORMAL LOW (ref 30.0–36.0)
MCV: 109.7 fL — ABNORMAL HIGH (ref 78.0–100.0)
MONO ABS: 1.3 10*3/uL — AB (ref 0.1–1.0)
Monocytes Relative: 10 %
NEUTROS ABS: 7.6 10*3/uL (ref 1.7–7.7)
Neutrophils Relative %: 58 %
PLATELETS: 234 10*3/uL (ref 150–400)
RBC: 2.88 MIL/uL — ABNORMAL LOW (ref 3.87–5.11)
RDW: 13.1 % (ref 11.5–15.5)
WBC: 13 10*3/uL — ABNORMAL HIGH (ref 4.0–10.5)

## 2015-01-12 LAB — COMPREHENSIVE METABOLIC PANEL
ALT: 18 U/L (ref 14–54)
AST: 34 U/L (ref 15–41)
Albumin: 3.5 g/dL (ref 3.5–5.0)
Alkaline Phosphatase: 78 U/L (ref 38–126)
Anion gap: 7 (ref 5–15)
BILIRUBIN TOTAL: 0.4 mg/dL (ref 0.3–1.2)
BUN: 19 mg/dL (ref 6–20)
CO2: 43 mmol/L — ABNORMAL HIGH (ref 22–32)
CREATININE: 0.67 mg/dL (ref 0.44–1.00)
Calcium: 9.5 mg/dL (ref 8.9–10.3)
Chloride: 90 mmol/L — ABNORMAL LOW (ref 101–111)
GFR calc Af Amer: >60 mL/min (ref >60)
GLUCOSE: 194 mg/dL — AB (ref 65–99)
Potassium: 3.9 mmol/L (ref 3.5–5.1)
Sodium: 140 mmol/L (ref 135–145)
TOTAL PROTEIN: 6.6 g/dL (ref 6.5–8.1)

## 2015-01-12 LAB — PROTIME-INR
INR: 1.11 (ref 0.00–1.49)
Prothrombin Time: 14.5 seconds (ref 11.6–15.2)

## 2015-01-12 MED ORDER — AZITHROMYCIN 250 MG PO TABS: ORAL_TABLET | ORAL | Status: AC

## 2015-01-12 MED ORDER — ONDANSETRON HCL 4 MG/2ML IJ SOLN
4.0000 mg | Freq: Once | INTRAMUSCULAR | Status: AC
  Administered 2015-01-12: 4 mg via INTRAVENOUS
  Filled 2015-01-12: qty 2

## 2015-01-12 MED ORDER — TETANUS-DIPHTH-ACELL PERTUSSIS 5-2.5-18.5 LF-MCG/0.5 IM SUSP
0.5000 mL | Freq: Once | INTRAMUSCULAR | Status: AC
  Administered 2015-01-12: 0.5 mL via INTRAMUSCULAR
  Filled 2015-01-12: qty 0.5

## 2015-01-12 MED ORDER — TETANUS-DIPHTH-ACELL PERTUSSIS 5-2.5-18.5 LF-MCG/0.5 IM SUSP
0.5000 mL | Freq: Once | INTRAMUSCULAR | Status: AC
  Administered 2015-01-12: 0.5 mL via INTRAMUSCULAR

## 2015-01-12 NOTE — ED Notes (Signed)
Pt. Was transferred via Ambulance from a fall at her private home.  She was walking into the bathroom without her oxygen and fell.  She hit the metal handle bar on her wall and knocked it off the wall.  Pt. Was found on the floor by her husband.  It was an unwitnessed fall.  Positive LOC.   Pt. Is alert and oriented she is answering questions and following commands.  Hematoma noted above her lt. Eyebrow.  Skintear to her lt. Knee and skin tear to her rt. arm

## 2015-01-12 NOTE — ED Provider Notes (Signed)
Please see original H&P for attestation statement.   LEVEL 5 CAVEAT APPLIES 2/2 AMS AND NO FAMILY PRESENT  Pt is an 81 year old female with unknown past medical history but which includes COPD on 4 L home oxygen who presents with closed head injury that occurred at home today. EMS reports that the patient was walking in her bathroom without her oxygen and fell forward, striking her forehead on a handle and knocking it off the wall. + LOC, fall was unwitnessed.    **see original H&P for completion of attestation.**  Laurence Spatesachel Morgan Dakarai Mcglocklin, MD 01/17/15 [phone]

## 2015-01-12 NOTE — Discharge Instructions (Signed)
You were evaluated in the emergency department following a fall. Your found to have pneumonia on your chest x-ray. Will start you on azithromycin for 5 days. Follow-up with your primary care physician on Monday. Into the emergency department for any worsening of symptoms.  Community-Acquired Pneumonia, Adult Pneumonia is an infection of the lungs. One type of pneumonia can happen while a person is in a hospital. A different type can happen when a person is not in a hospital (community-acquired pneumonia). It is easy for this kind to spread from person to person. It can spread to you if you breathe near an infected person who coughs or sneezes. Some symptoms include:  A dry cough.  A wet (productive) cough.  Fever.  Sweating.  Chest pain. HOME CARE  Take over-the-counter and prescription medicines only as told by your doctor.  Only take cough medicine if you are losing sleep.  If you were prescribed an antibiotic medicine, take it as told by your doctor. Do not stop taking the antibiotic even if you start to feel better.  Sleep with your head and neck raised (elevated). You can do this by putting a few pillows under your head, or you can sleep in a recliner.  Do not use tobacco products. These include cigarettes, chewing tobacco, and e-cigarettes. If you need help quitting, ask your doctor.  Drink enough water to keep your pee (urine) clear or pale yellow. A shot (vaccine) can help prevent pneumonia. Shots are often suggested for:  People older than 79 years of age.  People older than 79 years of age:  Who are having cancer treatment.  Who have long-term (chronic) lung disease.  Who have problems with their body's defense system (immune system). You may also prevent pneumonia if you take these actions:  Get the flu (influenza) shot every year.  Go to the dentist as often as told.  Wash your hands often. If soap and water are not available, use hand sanitizer. GET HELP  IF:  You have a fever.  You lose sleep because your cough medicine does not help. GET HELP RIGHT AWAY IF:  You are short of breath and it gets worse.  You have more chest pain.  Your sickness gets worse. This is very serious if:  You are an older adult.  Your body's defense system is weak.  You cough up blood.   This information is not intended to replace advice given to you by your health care provider. Make sure you discuss any questions you have with your health care provider.   Document Released: 07/09/2007 Document Revised: 10/11/2014 Document Reviewed: 05/17/2014 [employer] Interactive Patient Education [employer]2016 [employer]

## 2015-01-12 NOTE — ED Provider Notes (Signed)
CSN: [id number]     Arrival date & time 01/12/15  1534 History   First MD Initiated Contact with Patient 01/12/15 1538     Chief Complaint  Patient presents with  . Fall     (Consider location/radiation/quality/duration/timing/severity/associated sxs/prior Treatment) HPI  Kari Smith is a 81 y.o. female with an unknown PMH, known COPD (4L home O2), cirrhosis, who presents to the emergency department as an activated level II trauma following a fall from standing. Patient took off her home O2 to go to the bathroom.  Fell in the bathroom and hit her head on a handicap bar on the wall. Hit the left side of her face. The bar came off of the wall. Fall was unwitnessed. Reports loss of consciousness. On arrival, patient complaining of pain to her face. Denies chest pain, back pain, abdominal pain, shortness of breath, numbness or weakness of extremities.   Past Medical History  Diagnosis Date  . COPD (chronic obstructive pulmonary disease) (HCC)   . Emphysema/COPD (HCC)   . Liver cirrhosis (HCC)     due to medications ( REPORTED BY DAUGHTERS)  . Coronary artery disease   . Hypertension   . Blindness due to degeneration of eye    Past Surgical History  Procedure Laterality Date  . Coronary stent placement    . Knee arthroscopy     No family history on file. Social History  Substance Use Topics  . Smoking status: Former Games developer  . Smokeless tobacco: None  . Alcohol Use: No   OB History    No data available     Review of Systems  Constitutional: Negative for appetite change.  HENT: Positive for facial swelling. Negative for dental problem.   Eyes: Negative for visual disturbance.  Respiratory: Negative for chest tightness and shortness of breath.   Cardiovascular: Negative for chest pain and leg swelling.  Gastrointestinal: Negative for nausea, vomiting and abdominal pain.  Genitourinary: Negative for flank pain and pelvic pain.  Musculoskeletal: Negative for back pain,  joint swelling and neck pain.  Skin: Positive for wound. Negative for rash.  Neurological: Positive for facial asymmetry. Negative for dizziness, seizures, speech difficulty and weakness.  Psychiatric/Behavioral: Negative for behavioral problems.      Allergies  Penicillins  Home Medications   Prior to Admission medications   Medication Sig Start Date End Date Taking? Authorizing Provider  albuterol (PROVENTIL HFA;VENTOLIN HFA) 108 (90 BASE) MCG/ACT inhaler Inhale 1-2 puffs into the lungs every 6 (six) hours as needed for wheezing or shortness of breath.   Yes Historical Provider, MD  allopurinol (ZYLOPRIM) 100 MG tablet Take 100 mg by mouth daily.   Yes Historical Provider, MD  aspirin EC 81 MG tablet Take 81 mg by mouth every other day.   Yes Historical Provider, MD  atenolol (TENORMIN) 25 MG tablet Take 25 mg by mouth daily.   Yes Historical Provider, MD  azithromycin (ZITHROMAX Z-PAK) 250 MG tablet Day 1 - Take 2 tablets (500 mg), day 2-5 take 1 tablet (250 mg) 01/12/15 01/17/15  Corena Herter, MD  ferrous sulfate 325 (65 FE) MG tablet Take 325 mg by mouth 2 (two) times daily with a meal.   Yes Historical Provider, MD  Fluticasone-Salmeterol (ADVAIR) 250-50 MCG/DOSE AEPB Inhale 1 puff into the lungs 2 (two) times daily.   Yes Historical Provider, MD  furosemide (LASIX) 40 MG tablet Take 40 mg by mouth daily.   Yes Historical Provider, MD  ipratropium-albuterol (DUONEB) 0.5-2.5 (3) MG/3ML SOLN Take  3 mLs by nebulization every 6 (six) hours as needed (shortness of breath).   Yes Historical Provider, MD  lisinopril (PRINIVIL,ZESTRIL) 10 MG tablet Take 10 mg by mouth daily.   Yes Historical Provider, MD  omeprazole (PRILOSEC) 20 MG capsule Take 20 mg by mouth 2 (two) times daily before a meal.   Yes Historical Provider, MD  potassium chloride (K-DUR,KLOR-CON) 10 MEQ tablet Take 10 mEq by mouth 2 (two) times daily.   Yes Historical Provider, MD  simvastatin (ZOCOR) 20 MG tablet Take 20 mg by  mouth daily.   Yes Historical Provider, MD  vitamin B-12 (CYANOCOBALAMIN) 1000 MCG tablet Take 1,000 mcg by mouth daily.   Yes Historical Provider, MD  vitamin C (ASCORBIC ACID) 500 MG tablet Take 500 mg by mouth daily.   Yes Historical Provider, MD   BP 135/48 mmHg  Pulse 66  Temp(Src) 97.8 F (36.6 C) (Temporal)  Resp 22  SpO2 100% Physical Exam  Constitutional: She is oriented to person, place, and time. She appears well-developed and well-nourished. No distress.  Obese  HENT:  Head: Normocephalic. Head is without raccoon's eyes and without Battle's sign.  Right Ear: No hemotympanum.  Left Ear: No hemotympanum.  Nose: No nasal septal hematoma.  Mouth/Throat: Oropharynx is clear and moist.  Large hematoma over the left eyebrow and forehead. No facial instability.   Eyes: Conjunctivae and EOM are normal.  Neck: Normal range of motion. No JVD present. No tracheal deviation present.  Cervical collar in place  Cardiovascular: Normal rate, regular rhythm, normal heart sounds and intact distal pulses.   Pulmonary/Chest: Effort normal and breath sounds normal. No stridor. No respiratory distress. She exhibits no tenderness.  Abdominal: Bowel sounds are normal. She exhibits no distension. There is no tenderness. There is no rebound and no guarding.  Musculoskeletal: Normal range of motion. She exhibits no edema or tenderness.       Left knee: She exhibits ecchymosis. She exhibits normal range of motion, no swelling, no deformity and no erythema. Lacerations: small skin tear.  Neurological: She is alert and oriented to person, place, and time. She has normal strength. GCS eye subscore is 4. GCS verbal subscore is 5. GCS motor subscore is 6.  Skin: Skin is warm. No pallor.  Psychiatric: She has a normal mood and affect.  Nursing note and vitals reviewed.   ED Course  Procedures (including critical care time) Labs Review Labs Reviewed  COMPREHENSIVE METABOLIC PANEL - Abnormal; Notable  for the following:    Chloride 90 (*)    CO2 43 (*)    Glucose, Bld 194 (*)    All other components within normal limits  CBC WITH DIFFERENTIAL/PLATELET - Abnormal; Notable for the following:    WBC 13.0 (*)    RBC 2.88 (*)    Hemoglobin 8.5 (*)    HCT 31.6 (*)    MCV 109.7 (*)    MCHC 26.9 (*)    Monocytes Absolute 1.3 (*)    All other components within normal limits  PROTIME-INR  TYPE AND SCREEN    Imaging Review Dg Knee 2 Views Left  01/12/2015  CLINICAL DATA:  Left knee pain after fall.  Lacerations anteriorly. EXAM: LEFT KNEE - 1-2 VIEW COMPARISON:  None. FINDINGS: No acute fracture or dislocation. Mild lateral tibial femoral degenerative change with mild joint space narrowing and osteophytes. No joint effusion. Skin and soft tissue thickening anteriorly in the infrapatellar region with probable soft tissue laceration, no radiopaque foreign body. Dense atherosclerosis.  IMPRESSION: 1. No fracture or dislocation. 2. Soft tissue injury anteriorly in the infrapatellar region. No radiopaque foreign body. Electronically Signed   By: Rubye Oaks M.D.   On: 01/12/2015 19:14   Ct Head Wo Contrast  01/12/2015  CLINICAL DATA:  Unwitnessed fall. Found on floor earlier today. Head pain. Face pain. Neck pain. EXAM: CT HEAD WITHOUT CONTRAST CT MAXILLOFACIAL WITHOUT CONTRAST CT CERVICAL SPINE WITHOUT CONTRAST TECHNIQUE: Multidetector CT imaging of the head, cervical spine, and maxillofacial structures were performed using the standard protocol without intravenous contrast. Multiplanar CT image reconstructions of the cervical spine and maxillofacial structures were also generated. COMPARISON:  None. FINDINGS: CT HEAD FINDINGS No evidence for acute infarction, hemorrhage, mass lesion, hydrocephalus, or extra-axial fluid. Mild cerebral and cerebellar atrophy. Hypoattenuation of white matter suggesting small vessel disease. Large scalp hematoma over the LEFT frontal region. No skull fracture. No  contrecoup injury. Vascular calcification. CT MAXILLOFACIAL FINDINGS No visible facial fracture despite osteopenia. Patient is edentulous. Mandible and TMJs are intact. No sinus air-fluid level. BILATERAL cataract extraction without orbital findings of concern. No LEFT orbital hematoma. No orbital blowout injury. No nasal bone fracture or nasal cavity masses. Sclerotic appearing RIGHT middle ear and mastoid, with soft tissue in the aditus ad antrum and reduced number of air cells. Correlate clinically for chronic RIGHT mastoiditis. CT CERVICAL SPINE FINDINGS Motion degraded examination. Overall the study is diagnostic. There is no visible cervical spine fracture, traumatic subluxation, prevertebral soft tissue swelling, or intraspinal hematoma. Intervertebral disc spaces are fairly well preserved. Unremarkable C1-C2 articulation. Heavily calcified RIGHT greater than LEFT internal carotid arteries. No pneumothorax. Airway midline. No visible neck masses. IMPRESSION: Large scalp hematoma LEFT frontal region. No skull fracture or intracranial hemorrhage. Atrophy and small vessel disease. No visible facial fracture despite osteopenia. No orbital blowout injury or hematoma. Sclerotic appearing RIGHT middle ear and mastoid, correlate clinically for mastoiditis. No cervical spine fracture or traumatic subluxation. Electronically Signed   By: Elsie Stain M.D.   On: 01/12/2015 16:49   Ct Cervical Spine Wo Contrast  01/12/2015  CLINICAL DATA:  Unwitnessed fall. Found on floor earlier today. Head pain. Face pain. Neck pain. EXAM: CT HEAD WITHOUT CONTRAST CT MAXILLOFACIAL WITHOUT CONTRAST CT CERVICAL SPINE WITHOUT CONTRAST TECHNIQUE: Multidetector CT imaging of the head, cervical spine, and maxillofacial structures were performed using the standard protocol without intravenous contrast. Multiplanar CT image reconstructions of the cervical spine and maxillofacial structures were also generated. COMPARISON:  None. FINDINGS:  CT HEAD FINDINGS No evidence for acute infarction, hemorrhage, mass lesion, hydrocephalus, or extra-axial fluid. Mild cerebral and cerebellar atrophy. Hypoattenuation of white matter suggesting small vessel disease. Large scalp hematoma over the LEFT frontal region. No skull fracture. No contrecoup injury. Vascular calcification. CT MAXILLOFACIAL FINDINGS No visible facial fracture despite osteopenia. Patient is edentulous. Mandible and TMJs are intact. No sinus air-fluid level. BILATERAL cataract extraction without orbital findings of concern. No LEFT orbital hematoma. No orbital blowout injury. No nasal bone fracture or nasal cavity masses. Sclerotic appearing RIGHT middle ear and mastoid, with soft tissue in the aditus ad antrum and reduced number of air cells. Correlate clinically for chronic RIGHT mastoiditis. CT CERVICAL SPINE FINDINGS Motion degraded examination. Overall the study is diagnostic. There is no visible cervical spine fracture, traumatic subluxation, prevertebral soft tissue swelling, or intraspinal hematoma. Intervertebral disc spaces are fairly well preserved. Unremarkable C1-C2 articulation. Heavily calcified RIGHT greater than LEFT internal carotid arteries. No pneumothorax. Airway midline. No visible neck masses. IMPRESSION: Large scalp hematoma  LEFT frontal region. No skull fracture or intracranial hemorrhage. Atrophy and small vessel disease. No visible facial fracture despite osteopenia. No orbital blowout injury or hematoma. Sclerotic appearing RIGHT middle ear and mastoid, correlate clinically for mastoiditis. No cervical spine fracture or traumatic subluxation. Electronically Signed   By: Elsie StainJohn T Curnes M.D.   On: 01/12/2015 16:49   Dg Chest Port 1 View  01/12/2015  CLINICAL DATA:  Pain following fall. EXAM: PORTABLE CHEST 1 VIEW COMPARISON:  None. FINDINGS: There is an area of ill-defined opacity in the right upper lobe concerning for a small focus of pneumonia. There is mild  scarring in the left base. Lungs elsewhere clear. Heart is upper normal in size with pulmonary vascularity within normal limits. No adenopathy. No pneumothorax. There is evidence of an old healed fracture of the proximal left humerus with remodeling. IMPRESSION: Subtle area of increased opacity right upper lobe concerning for a small focus of pneumonia. Mild scarring left base. Lungs otherwise clear. Evidence of old trauma proximal left humerus. Followup PA and lateral chest radiographs recommended in 3-4 weeks following trial of antibiotic therapy to ensure resolution and exclude underlying malignancy. Electronically Signed   By: Bretta BangWilliam  Woodruff III M.D.   On: 01/12/2015 15:54   Ct Maxillofacial Wo Cm  01/12/2015  CLINICAL DATA:  Unwitnessed fall. Found on floor earlier today. Head pain. Face pain. Neck pain. EXAM: CT HEAD WITHOUT CONTRAST CT MAXILLOFACIAL WITHOUT CONTRAST CT CERVICAL SPINE WITHOUT CONTRAST TECHNIQUE: Multidetector CT imaging of the head, cervical spine, and maxillofacial structures were performed using the standard protocol without intravenous contrast. Multiplanar CT image reconstructions of the cervical spine and maxillofacial structures were also generated. COMPARISON:  None. FINDINGS: CT HEAD FINDINGS No evidence for acute infarction, hemorrhage, mass lesion, hydrocephalus, or extra-axial fluid. Mild cerebral and cerebellar atrophy. Hypoattenuation of white matter suggesting small vessel disease. Large scalp hematoma over the LEFT frontal region. No skull fracture. No contrecoup injury. Vascular calcification. CT MAXILLOFACIAL FINDINGS No visible facial fracture despite osteopenia. Patient is edentulous. Mandible and TMJs are intact. No sinus air-fluid level. BILATERAL cataract extraction without orbital findings of concern. No LEFT orbital hematoma. No orbital blowout injury. No nasal bone fracture or nasal cavity masses. Sclerotic appearing RIGHT middle ear and mastoid, with soft tissue  in the aditus ad antrum and reduced number of air cells. Correlate clinically for chronic RIGHT mastoiditis. CT CERVICAL SPINE FINDINGS Motion degraded examination. Overall the study is diagnostic. There is no visible cervical spine fracture, traumatic subluxation, prevertebral soft tissue swelling, or intraspinal hematoma. Intervertebral disc spaces are fairly well preserved. Unremarkable C1-C2 articulation. Heavily calcified RIGHT greater than LEFT internal carotid arteries. No pneumothorax. Airway midline. No visible neck masses. IMPRESSION: Large scalp hematoma LEFT frontal region. No skull fracture or intracranial hemorrhage. Atrophy and small vessel disease. No visible facial fracture despite osteopenia. No orbital blowout injury or hematoma. Sclerotic appearing RIGHT middle ear and mastoid, correlate clinically for mastoiditis. No cervical spine fracture or traumatic subluxation. Electronically Signed   By: Elsie StainJohn T Curnes M.D.   On: 01/12/2015 16:49   I have personally reviewed and evaluated these images and lab results as part of my medical decision-making.   EKG Interpretation None      MDM   Final diagnoses:  Fall, initial encounter  Hematoma  CAP (community acquired pneumonia)    Patient is an 81 year old female with past medical history significant for COPD (4L home O2), cirrhosis, CAD (ASA), hypertension, who presents to the emergency department as an activated  level II trauma following a fall from standing. On arrival ABCs intact. GCS of 15. Afebrile, hemodynamically stable. SpO2 95% on 4L [place]. Exam as above, notable for large hematoma over the left forehead, pupils equal round and reactive, skin abrasion to right upper arm and left knee, breath sounds equal bilaterally, benign abdominal exam.  CXR showed no pneumothorax, no rib fractures, concerns for pneumonia. CT head, face, c spine, XR L knee and trauma labs obtained. Do not feel that the patient needs further imaging of her chest,  abdomen, pelvis at this time. Labs unremarkable. Hgb stable. INR 1.1  Given the patient's history of COPD with treat for community acquired pneumonia. Patient without worsening shortness of breath, on her baseline 4L home O2. Do not feel that the patient needs admission for CAP. Patient able to ambulate in the ED with a walker. Lives at home with her 2 daughters and has full-time assistance. Patient discharged home in stable condition. Discussed following up with her primary care physician on Monday. Given a prescription for azithromycin. Discussed strict return precautions to the emergency department. Stable at time of discharge, patient and family expressed understanding, no questions or concerns at time of discharge.  Corena Herter, MD 01/13/15 1610  Laurence Spates, MD 2015-01-26 1528  Laurence Spates, MD 01/17/15 [phone]

## 2015-01-12 NOTE — ED Notes (Signed)
Pt assisted to wheelchair and into car with moderate assistance. Pt appears generally weak. Family reports this is baseline for patient and insist on taking pt home.

## 2015-01-12 NOTE — ED Notes (Signed)
Family updated as to patient's status by Dr. Arnoldo MoraleMumma

## 2015-01-12 NOTE — ED Notes (Signed)
Attempted to ambulate patient with two other staff members. Family reports pt typically stays in the bed throughout the day unless using the bathroom. Pt refusing to get out of bed. States "Not right now, maybe another time." Abrasion to L knee cleansed with dressing applied. Dr.Mumma made aware and at bedside

## 2015-01-12 NOTE — ED Notes (Signed)
Pt ambulated a few steps with walker and staff. Family reports baseline for patient. Assisted pt back to bed. Dr. Arnoldo MoraleMumma aware

## 2015-01-12 NOTE — ED Notes (Signed)
Family at beside. Family given emotional support. 

## 2015-01-12 NOTE — ED Notes (Signed)
Water given to pt. Per MD.  Chipper OmanIce pak offered. Pt.'s family stated, "She will not want that."

## 2015-01-12 NOTE — Progress Notes (Signed)
Orthopedic Tech Progress Note Patient Details:  Kari Smith Apr 09, 1927 [id number]  Patient ID: Kari FreesMary Elizabeth Smith, female   DOB: Apr 09, 1927, 81 y.o.   MRN: [id number]   Saul FordyceJennifer C Jathniel Smeltzer 01/12/2015, 3:53 PMLevel 2 Trauma.

## 2015-01-15 ENCOUNTER — Inpatient Hospital Stay
Admission: EM | Admit: 2015-01-15 | Discharge: 2015-02-04 | DRG: 190 | Disposition: E | Payer: Medicare Other | Attending: Internal Medicine | Admitting: Internal Medicine

## 2015-01-15 ENCOUNTER — Emergency Department: Payer: Medicare Other

## 2015-01-15 DIAGNOSIS — R443 Hallucinations, unspecified: Secondary | ICD-10-CM | POA: Diagnosis present

## 2015-01-15 DIAGNOSIS — D638 Anemia in other chronic diseases classified elsewhere: Secondary | ICD-10-CM | POA: Diagnosis present

## 2015-01-15 DIAGNOSIS — Z880 Allergy status to penicillin: Secondary | ICD-10-CM

## 2015-01-15 DIAGNOSIS — H9190 Unspecified hearing loss, unspecified ear: Secondary | ICD-10-CM

## 2015-01-15 DIAGNOSIS — G9340 Encephalopathy, unspecified: Secondary | ICD-10-CM

## 2015-01-15 DIAGNOSIS — J9621 Acute and chronic respiratory failure with hypoxia: Secondary | ICD-10-CM | POA: Diagnosis present

## 2015-01-15 DIAGNOSIS — Z9889 Other specified postprocedural states: Secondary | ICD-10-CM

## 2015-01-15 DIAGNOSIS — J441 Chronic obstructive pulmonary disease with (acute) exacerbation: Secondary | ICD-10-CM | POA: Diagnosis present

## 2015-01-15 DIAGNOSIS — Z79899 Other long term (current) drug therapy: Secondary | ICD-10-CM

## 2015-01-15 DIAGNOSIS — R0689 Other abnormalities of breathing: Secondary | ICD-10-CM | POA: Diagnosis present

## 2015-01-15 DIAGNOSIS — Z66 Do not resuscitate: Secondary | ICD-10-CM | POA: Diagnosis present

## 2015-01-15 DIAGNOSIS — I739 Peripheral vascular disease, unspecified: Secondary | ICD-10-CM | POA: Diagnosis present

## 2015-01-15 DIAGNOSIS — E872 Acidosis: Secondary | ICD-10-CM | POA: Diagnosis present

## 2015-01-15 DIAGNOSIS — Z9849 Cataract extraction status, unspecified eye: Secondary | ICD-10-CM | POA: Diagnosis not present

## 2015-01-15 DIAGNOSIS — R4182 Altered mental status, unspecified: Secondary | ICD-10-CM

## 2015-01-15 DIAGNOSIS — R778 Other specified abnormalities of plasma proteins: Secondary | ICD-10-CM

## 2015-01-15 DIAGNOSIS — H540 Blindness, both eyes: Secondary | ICD-10-CM | POA: Diagnosis present

## 2015-01-15 DIAGNOSIS — R55 Syncope and collapse: Secondary | ICD-10-CM

## 2015-01-15 DIAGNOSIS — R748 Abnormal levels of other serum enzymes: Secondary | ICD-10-CM | POA: Diagnosis present

## 2015-01-15 DIAGNOSIS — Z9181 History of falling: Secondary | ICD-10-CM

## 2015-01-15 DIAGNOSIS — J9691 Respiratory failure, unspecified with hypoxia: Secondary | ICD-10-CM | POA: Diagnosis not present

## 2015-01-15 DIAGNOSIS — W19XXXA Unspecified fall, initial encounter: Secondary | ICD-10-CM | POA: Diagnosis present

## 2015-01-15 DIAGNOSIS — Z515 Encounter for palliative care: Secondary | ICD-10-CM | POA: Diagnosis present

## 2015-01-15 DIAGNOSIS — J440 Chronic obstructive pulmonary disease with acute lower respiratory infection: Principal | ICD-10-CM | POA: Diagnosis present

## 2015-01-15 DIAGNOSIS — I2510 Atherosclerotic heart disease of native coronary artery without angina pectoris: Secondary | ICD-10-CM | POA: Diagnosis present

## 2015-01-15 DIAGNOSIS — J9692 Respiratory failure, unspecified with hypercapnia: Secondary | ICD-10-CM | POA: Diagnosis not present

## 2015-01-15 DIAGNOSIS — Z955 Presence of coronary angioplasty implant and graft: Secondary | ICD-10-CM

## 2015-01-15 DIAGNOSIS — I10 Essential (primary) hypertension: Secondary | ICD-10-CM | POA: Diagnosis present

## 2015-01-15 DIAGNOSIS — K7460 Unspecified cirrhosis of liver: Secondary | ICD-10-CM | POA: Diagnosis present

## 2015-01-15 DIAGNOSIS — Z7982 Long term (current) use of aspirin: Secondary | ICD-10-CM | POA: Diagnosis not present

## 2015-01-15 DIAGNOSIS — H539 Unspecified visual disturbance: Secondary | ICD-10-CM

## 2015-01-15 DIAGNOSIS — J189 Pneumonia, unspecified organism: Secondary | ICD-10-CM | POA: Diagnosis present

## 2015-01-15 DIAGNOSIS — G9341 Metabolic encephalopathy: Secondary | ICD-10-CM | POA: Diagnosis present

## 2015-01-15 DIAGNOSIS — Z800 Family history of malignant neoplasm of digestive organs: Secondary | ICD-10-CM | POA: Diagnosis not present

## 2015-01-15 DIAGNOSIS — S0003XA Contusion of scalp, initial encounter: Secondary | ICD-10-CM | POA: Diagnosis present

## 2015-01-15 DIAGNOSIS — Z87891 Personal history of nicotine dependence: Secondary | ICD-10-CM

## 2015-01-15 DIAGNOSIS — J449 Chronic obstructive pulmonary disease, unspecified: Secondary | ICD-10-CM | POA: Diagnosis not present

## 2015-01-15 DIAGNOSIS — H3530 Unspecified macular degeneration: Secondary | ICD-10-CM | POA: Diagnosis present

## 2015-01-15 DIAGNOSIS — R0902 Hypoxemia: Secondary | ICD-10-CM

## 2015-01-15 DIAGNOSIS — J9622 Acute and chronic respiratory failure with hypercapnia: Secondary | ICD-10-CM | POA: Diagnosis present

## 2015-01-15 DIAGNOSIS — R7989 Other specified abnormal findings of blood chemistry: Secondary | ICD-10-CM

## 2015-01-15 HISTORY — DX: Peripheral vascular disease, unspecified: I73.9

## 2015-01-15 HISTORY — DX: Anemia, unspecified: D64.9

## 2015-01-15 LAB — TYPE AND SCREEN
ABO/RH(D): A POS
ANTIBODY SCREEN: POSITIVE
DAT, IgG: NEGATIVE
PT AG Type: NEGATIVE
Unit division: 0
Unit division: 0

## 2015-01-15 LAB — URINALYSIS COMPLETE WITH MICROSCOPIC (ARMC ONLY)
BACTERIA UA: NONE SEEN
BILIRUBIN URINE: NEGATIVE
GLUCOSE, UA: NEGATIVE mg/dL
HGB URINE DIPSTICK: NEGATIVE
Ketones, ur: NEGATIVE mg/dL
Leukocytes, UA: NEGATIVE
NITRITE: NEGATIVE
PH: 5 (ref 5.0–8.0)
Protein, ur: NEGATIVE mg/dL
RBC / HPF: NONE SEEN RBC/hpf (ref 0–5)
SPECIFIC GRAVITY, URINE: 1.013 (ref 1.005–1.030)

## 2015-01-15 LAB — CBC WITH DIFFERENTIAL/PLATELET
BASOS ABS: 0.2 10*3/uL — AB (ref 0–0.1)
Basophils Relative: 1 %
EOS ABS: 0 10*3/uL (ref 0–0.7)
EOS PCT: 0 %
HCT: 24.8 % — ABNORMAL LOW (ref 35.0–47.0)
Hemoglobin: 7.3 g/dL — ABNORMAL LOW (ref 12.0–16.0)
Lymphocytes Relative: 8 %
Lymphs Abs: 1.2 10*3/uL (ref 1.0–3.6)
MCH: 29.8 pg (ref 26.0–34.0)
MCHC: 29.5 g/dL — ABNORMAL LOW (ref 32.0–36.0)
MCV: 100.9 fL — ABNORMAL HIGH (ref 80.0–100.0)
Monocytes Absolute: 1.4 10*3/uL — ABNORMAL HIGH (ref 0.2–0.9)
Monocytes Relative: 10 %
Neutro Abs: 12 10*3/uL — ABNORMAL HIGH (ref 1.4–6.5)
Neutrophils Relative %: 81 %
PLATELETS: 224 10*3/uL (ref 150–440)
RBC: 2.46 MIL/uL — AB (ref 3.80–5.20)
RDW: 14.5 % (ref 11.5–14.5)
WBC: 14.8 10*3/uL — AB (ref 3.6–11.0)

## 2015-01-15 LAB — BRAIN NATRIURETIC PEPTIDE: B NATRIURETIC PEPTIDE 5: 983 pg/mL — AB (ref 0.0–100.0)

## 2015-01-15 LAB — COMPREHENSIVE METABOLIC PANEL
ALT: 17 U/L (ref 14–54)
AST: 32 U/L (ref 15–41)
Albumin: 3.2 g/dL — ABNORMAL LOW (ref 3.5–5.0)
Alkaline Phosphatase: 67 U/L (ref 38–126)
Anion gap: 3 — ABNORMAL LOW (ref 5–15)
BUN: 54 mg/dL — ABNORMAL HIGH (ref 6–20)
CHLORIDE: 93 mmol/L — AB (ref 101–111)
CO2: 48 mmol/L — ABNORMAL HIGH (ref 22–32)
Calcium: 9.6 mg/dL (ref 8.9–10.3)
Creatinine, Ser: 0.96 mg/dL (ref 0.44–1.00)
GFR, EST NON AFRICAN AMERICAN: 52 mL/min — AB (ref >60)
Glucose, Bld: 153 mg/dL — ABNORMAL HIGH (ref 65–99)
POTASSIUM: 5.5 mmol/L — AB (ref 3.5–5.1)
Sodium: 144 mmol/L (ref 135–145)
Total Bilirubin: 0.7 mg/dL (ref 0.3–1.2)
Total Protein: 6.6 g/dL (ref 6.5–8.1)

## 2015-01-15 LAB — BLOOD GAS, ARTERIAL
ALLENS TEST (PASS/FAIL): POSITIVE — AB
ALLENS TEST (PASS/FAIL): POSITIVE — AB
Acid-Base Excess: 24.3 mmol/L — ABNORMAL HIGH (ref 0.0–3.0)
Bicarbonate: 53.6 mEq/L — ABNORMAL HIGH (ref 21.0–28.0)
DELIVERY SYSTEMS: POSITIVE
EXPIRATORY PAP: 5
FIO2: 1
FIO2: 0.4
INSPIRATORY PAP: 12
O2 Saturation: 88.8 %
PATIENT TEMPERATURE: 37
PCO2 ART: 114 mmHg — AB (ref 32.0–48.0)
PH ART: 7.28 — AB (ref 7.350–7.450)
PO2 ART: 219 mmHg — AB (ref 83.0–108.0)
Patient temperature: 37
pCO2 arterial: 155 mmHg (ref 32.0–48.0)
pH, Arterial: 7.17 — CL (ref 7.350–7.450)
pO2, Arterial: 63 mmHg — ABNORMAL LOW (ref 83.0–108.0)

## 2015-01-15 LAB — PROTIME-INR
INR: 1.09
PROTHROMBIN TIME: 14.3 s (ref 11.4–15.0)

## 2015-01-15 LAB — LIPASE, BLOOD: LIPASE: 89 U/L — AB (ref 11–51)

## 2015-01-15 LAB — LACTIC ACID, PLASMA
LACTIC ACID, VENOUS: 1.2 mmol/L (ref 0.5–2.0)
LACTIC ACID, VENOUS: 1.2 mmol/L (ref 0.5–2.0)

## 2015-01-15 LAB — TROPONIN I
TROPONIN I: 0.1 ng/mL — AB (ref <0.031)
Troponin I: 0.13 ng/mL — ABNORMAL HIGH (ref <0.031)

## 2015-01-15 MED ORDER — LORAZEPAM 2 MG/ML IJ SOLN: 5.0000 mg/h | INTRAVENOUS | Status: DC

## 2015-01-15 MED ORDER — PANTOPRAZOLE SODIUM 40 MG PO TBEC: 40.0000 mg | DELAYED_RELEASE_TABLET | Freq: Every day | ORAL | Status: DC

## 2015-01-15 MED ORDER — ASPIRIN EC 81 MG PO TBEC: 81.0000 mg | DELAYED_RELEASE_TABLET | ORAL | Status: DC

## 2015-01-15 MED ORDER — ACETAMINOPHEN 650 MG RE SUPP: 650.0000 mg | Freq: Four times a day (QID) | RECTAL | Status: DC | PRN

## 2015-01-15 MED ORDER — IPRATROPIUM-ALBUTEROL 0.5-2.5 (3) MG/3ML IN SOLN
3.0000 mL | Freq: Once | RESPIRATORY_TRACT | Status: AC
  Administered 2015-01-15: 3 mL via RESPIRATORY_TRACT
  Filled 2015-01-15: qty 3

## 2015-01-15 MED ORDER — IPRATROPIUM-ALBUTEROL 0.5-2.5 (3) MG/3ML IN SOLN
3.0000 mL | Freq: Four times a day (QID) | RESPIRATORY_TRACT | Status: DC
  Administered 2015-01-15: 3 mL via RESPIRATORY_TRACT
  Filled 2015-01-15: qty 3

## 2015-01-15 MED ORDER — MORPHINE SULFATE (PF) 2 MG/ML IV SOLN
2.0000 mg | INTRAVENOUS | Status: DC | PRN
  Administered 2015-01-15: 4 mg via INTRAVENOUS
  Filled 2015-01-15: qty 2

## 2015-01-15 MED ORDER — ALLOPURINOL 100 MG PO TABS: 100.0000 mg | ORAL_TABLET | Freq: Every day | ORAL | Status: DC

## 2015-01-15 MED ORDER — ACETAMINOPHEN 325 MG PO TABS: 650.0000 mg | ORAL_TABLET | Freq: Four times a day (QID) | ORAL | Status: DC | PRN

## 2015-01-15 MED ORDER — LEVOFLOXACIN IN D5W 750 MG/150ML IV SOLN
750.0000 mg | Freq: Once | INTRAVENOUS | Status: AC
  Administered 2015-01-15: 750 mg via INTRAVENOUS
  Filled 2015-01-15: qty 150

## 2015-01-15 MED ORDER — LEVOFLOXACIN IN D5W 750 MG/150ML IV SOLN: 750.0000 mg | INTRAVENOUS | Status: DC

## 2015-01-15 MED ORDER — LEVOFLOXACIN IN D5W 500 MG/100ML IV SOLN: 500.0000 mg | INTRAVENOUS | Status: DC

## 2015-01-15 MED ORDER — MORPHINE SULFATE (PF) 2 MG/ML IV SOLN
2.0000 mg | INTRAVENOUS | Status: DC | PRN
  Administered 2015-01-15 (×4): 4 mg via INTRAVENOUS
  Filled 2015-01-15 (×4): qty 2

## 2015-01-15 MED ORDER — ENOXAPARIN SODIUM 40 MG/0.4ML ~~LOC~~ SOLN
40.0000 mg | SUBCUTANEOUS | Status: DC
  Administered 2015-01-15: 40 mg via SUBCUTANEOUS
  Filled 2015-01-15 (×2): qty 0.4

## 2015-01-15 MED ORDER — BUDESONIDE 0.25 MG/2ML IN SUSP
0.2500 mg | Freq: Two times a day (BID) | RESPIRATORY_TRACT | Status: DC
  Administered 2015-01-15: 0.25 mg via RESPIRATORY_TRACT

## 2015-01-15 MED ORDER — LORAZEPAM 2 MG/ML IJ SOLN
2.0000 mg | INTRAMUSCULAR | Status: DC | PRN
  Administered 2015-01-15: 2 mg via INTRAVENOUS
  Filled 2015-01-15: qty 1

## 2015-01-15 MED ORDER — METHYLPREDNISOLONE SODIUM SUCC 125 MG IJ SOLR
60.0000 mg | Freq: Four times a day (QID) | INTRAMUSCULAR | Status: DC
Start: 2015-01-15 — End: 2015-01-15
  Administered 2015-01-15: 60 mg via INTRAVENOUS
  Filled 2015-01-15: qty 2

## 2015-01-15 MED ORDER — PANTOPRAZOLE SODIUM 40 MG IV SOLR
40.0000 mg | INTRAVENOUS | Status: DC
  Administered 2015-01-15: 40 mg via INTRAVENOUS
  Filled 2015-01-15: qty 40

## 2015-01-15 MED ORDER — LORAZEPAM BOLUS VIA INFUSION: 2.0000 mg | INTRAVENOUS | Status: DC | PRN

## 2015-01-16 LAB — URINE CULTURE
CULTURE: NO GROWTH
Special Requests: NORMAL

## 2015-01-20 LAB — CULTURE, BLOOD (ROUTINE X 2)
CULTURE: NO GROWTH
CULTURE: NO GROWTH

## 2015-02-04 NOTE — Consult Note (Signed)
[facility] [place] Pulmonary Medicine Consultation      Name: Kari Smith MRN: [id number] DOB: 1927-02-17    ADMISSION DATE:  27-Jan-2015    CHIEF COMPLAINT:   Acute resp failure   HISTORY OF PRESENT ILLNESS  82 yo white female with acute severe resp distress, on biPAP, patient unresponsive, using accessory muscles to breathe Family at bedside they have confirmed patient is DNR/DNI Patient had fallen several days ago and was acting confused prior to fall, patient can NOT provide ROS  I have explained to family that patient is in dying process     PAST MEDICAL HISTORY    :  Past Medical History  Diagnosis Date  . COPD (chronic obstructive pulmonary disease) (HCC)   . Emphysema/COPD (HCC)   . Liver cirrhosis (HCC)     due to medications ( REPORTED BY DAUGHTERS)  . Coronary artery disease   . Hypertension   . 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 Anemia   . Peripheral vascular disease [facility])    Past Surgical History  Procedure Laterality Date  . Coronary stent placement    . Knee arthroscopy    . Vascular surgery    . Shoulder surgery    . Cataracts     Prior to Admission medications   Medication Sig Start Date End Date Taking? Authorizing Provider  albuterol (PROVENTIL HFA;VENTOLIN HFA) 108 (90 BASE) MCG/ACT inhaler Inhale 1-2 puffs into the lungs every 6 (six) hours as needed for wheezing or shortness of breath.   Yes Historical Provider, MD  albuterol (PROVENTIL) (2.5 MG/3ML) 0.083% nebulizer solution Take 2.5 mg by nebulization every 6 (six) hours as needed for wheezing or shortness of breath.   Yes Historical Provider, MD  albuterol-ipratropium (COMBIVENT) 18-103 MCG/ACT inhaler Inhale 2 puffs into the lungs every 4 (four) hours as needed for wheezing or shortness of breath.   Yes Historical Provider, MD  alendronate (FOSAMAX) 70 MG tablet Take 70 mg by mouth once a week. Take with a full glass of water on an empty stomach.   Yes Historical Provider, MD    allopurinol (ZYLOPRIM) 100 MG tablet Take 100 mg by mouth daily.   Yes Historical Provider, MD  aspirin EC 81 MG tablet Take 81 mg by mouth every other day.   Yes Historical Provider, MD  atenolol (TENORMIN) 25 MG tablet Take 25 mg by mouth daily.   Yes Historical Provider, MD  azithromycin (ZITHROMAX Z-PAK) 250 MG tablet Day 1 - Take 2 tablets (500 mg), day 2-5 take 1 tablet (250 mg) 01/12/15 01/17/15 Yes Corena Herter, MD  ferrous sulfate 325 (65 FE) MG tablet Take 325 mg by mouth 2 (two) times daily with a meal.   Yes Historical Provider, MD  furosemide (LASIX) 80 MG tablet Take 80 mg by mouth daily.   Yes Historical Provider, MD  guaiFENesin (MUCINEX) 600 MG 12 hr tablet Take 600 mg by mouth daily as needed.   Yes Historical Provider, MD  lisinopril (PRINIVIL,ZESTRIL) 20 MG tablet Take 20 mg by mouth daily.   Yes Historical Provider, MD  Omega-3 Fatty Acids (FISH OIL PO) Take 1 capsule by mouth daily.   Yes Historical Provider, MD  omeprazole (PRILOSEC) 20 MG capsule Take 20 mg by mouth 2 (two) times daily before a meal.   Yes Historical Provider, MD  potassium chloride (K-DUR,KLOR-CON) 10 MEQ tablet Take 20 mEq by mouth 2 (two) times daily.    Yes Historical Provider, MD  simvastatin (ZOCOR) 20 MG  tablet Take 20 mg by mouth daily.   Yes Historical Provider, MD  Vitamin D, Ergocalciferol, (DRISDOL) 50000 UNITS CAPS capsule Take 50,000 Units by mouth every 7 (seven) days.   Yes Historical Provider, MD   Allergies  Allergen Reactions  . Penicillins Other (See Comments)    UNKNOWN  Has patient had a PCN reaction causing immediate rash, facial/tongue/throat swelling, SOB or lightheadedness with hypotension: unknown Has patient had a PCN reaction causing severe rash involving mucus membranes or skin necrosis: unknown Has patient had a PCN reaction that required hospitalization unknown Has patient had a PCN reaction occurring within the last 10 years: unknown If all of the above answers are  "NO", then may proceed with Cephalosporin use.     FAMILY HISTORY   Family History  Problem Relation Age of Onset  . Stomach cancer Mother       SOCIAL HISTORY    reports that she has quit smoking. She does not have any smokeless tobacco history on file. She reports that she does not drink alcohol or use illicit drugs.  Review of Systems  Unable to perform ROS: critical illness      VITAL SIGNS    Temp:  [97.2 F (36.2 C)-98.5 F (36.9 C)] 97.2 F (36.2 C) (12/12 1300) Pulse Rate:  [75-101] 78 (12/12 1300) Resp:  [30-32] 31 (12/12 1300) BP: (107-135)/(62-73) 107/66 mmHg (12/12 1300) SpO2:  [65 %-100 %] 94 % (12/12 1300) FiO2 (%):  [60 %] 60 % (12/12 1300) Weight:  [156 lb (70.761 kg)-163 lb 2.3 oz (74 kg)] 156 lb (70.761 kg) (12/12 1300) HEMODYNAMICS:   VENTILATOR SETTINGS: Vent Mode:  [-]  FiO2 (%):  [60 %] 60 % INTAKE / OUTPUT: No intake or output data in the 24 hours ending 2015/01/26 1404     PHYSICAL EXAM   Physical Exam  Constitutional: She appears distressed.  Eyes: Conjunctivae are normal. No scleral icterus.  Neck: Normal range of motion. Neck supple.  Cardiovascular: Normal rate.   No murmur heard. Pulmonary/Chest: She is in respiratory distress. She has rales.  Abdominal: Soft. She exhibits distension.  Musculoskeletal: She exhibits edema.  Neurological:  Gcs<8, comatosed,lethargic  Skin: Skin is warm. She is diaphoretic.       LABS   LABS:  CBC  Recent Labs Lab 01/12/15 1539 01-26-2015 0745  WBC 13.0* 14.8*  HGB 8.5* 7.3*  HCT 31.6* 24.8*  PLT 234 224   Coag's  Recent Labs Lab 01/12/15 1539 01-26-2015 0745  INR 1.11 1.09   BMET  Recent Labs Lab 01/12/15 1539 01-26-15 0745  NA 140 144  K 3.9 5.5*  CL 90* 93*  CO2 43* 48*  BUN 19 54*  CREATININE 0.67 0.96  GLUCOSE 194* 153*   Electrolytes  Recent Labs Lab 01/12/15 1539 2015-01-26 0745  CALCIUM 9.5 9.6   Sepsis Markers  Recent Labs Lab 01-26-2015 0805  January 26, 2015 1136  LATICACIDVEN 1.2 1.2   ABG  Recent Labs Lab January 26, 2015 0750 01/26/15 0940  PHART 7.17* 7.28*  PCO2ART 155* 114*  PO2ART 219* 63*   Liver Enzymes  Recent Labs Lab 01/12/15 1539 2015/01/26 0745  AST 34 32  ALT 18 17  ALKPHOS 78 67  BILITOT 0.4 0.7  ALBUMIN 3.5 3.2*   Cardiac Enzymes  Recent Labs Lab January 26, 2015 0745 2015/01/26 1313  TROPONINI 0.10* 0.13*   Glucose No results for input(s): GLUCAP in the last 168 hours.   No results found for this or any previous visit (from the past  240 hour(s)).   Current facility-administered medications:  .  acetaminophen (TYLENOL) tablet 650 mg, 650 mg, Oral, Q6H PRN **OR** acetaminophen (TYLENOL) suppository 650 mg, 650 mg, Rectal, Q6H PRN, Alford Highlandichard Wieting, MD .  allopurinol (ZYLOPRIM) tablet 100 mg, 100 mg, Oral, Daily, Alford Highlandichard Wieting, MD .  aspirin EC tablet 81 mg, 81 mg, Oral, QODAY, Alford Highlandichard Wieting, MD .  budesonide (PULMICORT) nebulizer solution 0.25 mg, 0.25 mg, Nebulization, BID, Alford Highlandichard Wieting, MD, 0.25 mg at Jul 03, 2014 1045 .  enoxaparin (LOVENOX) injection 40 mg, 40 mg, Subcutaneous, Q24H, Alford Highlandichard Wieting, MD .  ipratropium-albuterol (DUONEB) 0.5-2.5 (3) MG/3ML nebulizer solution 3 mL, 3 mL, Nebulization, Q6H, Alford Highlandichard Wieting, MD, 3 mL at Jul 03, 2014 1203 .  [START ON 01/17/2015] levofloxacin (LEVAQUIN) IVPB 750 mg, 750 mg, Intravenous, Q48H, Alford Highlandichard Wieting, MD .  methylPREDNISolone sodium succinate (SOLU-MEDROL) 125 mg/2 mL injection 60 mg, 60 mg, Intravenous, Q6H, Alford Highlandichard Wieting, MD, 60 mg at Jul 03, 2014 1208 .  pantoprazole (PROTONIX) EC tablet 40 mg, 40 mg, Oral, Daily, Alford Highlandichard Wieting, MD  IMAGING    Ct Head Wo Contrast  10-30-14  CLINICAL DATA:  Altered mental status and weakness since falling 3 days ago. Left forehead swelling. EXAM: CT HEAD WITHOUT CONTRAST TECHNIQUE: Contiguous axial images were obtained from the base of the skull through the vertex without intravenous contrast. COMPARISON:  Limited  correlation made with neck CTA 01/14/2011. FINDINGS: There is no evidence of acute intracranial hemorrhage, mass lesion, brain edema or extra-axial fluid collection. The ventricles and subarachnoid spaces are appropriately sized for age. There is no CT evidence of acute cortical infarction. There are chronic small vessel ischemic changes within the periventricular white matter and basal ganglia. Intracranial vascular calcifications noted. There is a left frontal scalp soft tissue hematoma. The mastoid air cells on the right are under pneumatized and partially opacified, similar to prior study. The visualized paranasal sinuses, mastoid air cells and middle ears are otherwise clear. The calvarium is intact. IMPRESSION: 1. No acute intracranial findings. 2. Chronic small vessel ischemic changes in the periventricular white matter and left frontal scalp hematoma noted. Electronically Signed   By: Carey BullocksWilliam  Veazey M.D.   On: 10-30-14 09:45   Dg Chest Port 1 View  10-30-14  CLINICAL DATA:  Syncope, history of COPD, cirrhosis, and coronary artery disease. EXAM: PORTABLE CHEST 1 VIEW COMPARISON:  Portable chest x-ray of November 08, 2013. FINDINGS: There is a new right suprahilar density. The interstitial markings elsewhere in both lungs remain increased but are stable. The hemidiaphragm on the left is better demonstrated today. The cardiac silhouette is enlarged. The pulmonary vascularity is not clearly engorged. There is calcification wall of the aortic arch. The observed bony thorax is unremarkable. IMPRESSION: New right suprahilar density. This may reflect pneumonia but neoplasm is not excluded. If the patient does not have clinical findings suspicious for pneumonia, CT scanning of the chest now is recommended. If there are clinical findings suggestive of pneumonia, followup PA and lateral chest X-ray is recommended in 3-4 weeks following trial of antibiotic therapy to ensure resolution and exclude underlying  malignancy. Electronically Signed   By: David  SwazilandJordan M.D.   On: 10-30-14 07:59      INDWELLING DEVICES::  MICRO DATA: MRSA PCR >> Urine  Blood>> Resp   ANTIMICROBIALS:  levaquin 12/12>>    ASSESSMENT/PLAN   82 yo white female with acute and severe resp failure from acute COPD exacerbation with severe hypoxic resp failure likely pneumonia with progressive encephalopathy    PULMONARY -Respiratory Failure -  patient is DNR/DNI -continue biPAP as tolerated-titrate iPAP and ePAP to get TV around 500 -wean fio2 as tolerated -continue neb therapy -Iv steroids as prescribed, IV abx as prescribed -morphine as needed   CARDIOVASCULAR No need for CVL at this time  RENAL follow chem 7, UO  GASTROINTESTINAL NPO -stop all oral meds   HEMATOLOGIC Follow CBC  INFECTIOUS A: pneumonia P:   BCx2 >> Abx: levaquin 12/12>>  ENDOCRINE - ICU hypoglycemic\Hyperglycemia protocol   NEUROLOGIC A:  encephlopathy from hypoxia and hyercapnia P:   Morphine as needed for suffering  It is highly possible that patient may not survive tonight, patients family updated and were made aware of very poor prognosis I have suggests that if patient does NOT respond to therapy in next 12-24 hrs, will likely consider comfort care measures   I have personally obtained a history, examined the patient, evaluated laboratory and independently reviewed  imaging results, formulated the assessment and plan and placed orders.  cxr images reviewed 2015/01/26   The Patient requires high complexity decision making for assessment and support, frequent evaluation and titration of therapies, application of advanced monitoring technologies and extensive interpretation of multiple databases. Critical Care Time devoted to patient care services described in this note is 45 mins   Overall, patient is critically ill, prognosis is guarded. Patient at high risk for cardiac arrest and death.    Lucie Leather, M.D.  [facility]  Medical Director [facility] Medical Director [facility] Cardio-Pulmonary Department

## 2015-02-04 NOTE — ED Notes (Addendum)
Multiple areas of bruising noted. Pt has bruising on left side around eye and face. Bruising on bilateral arm and legs. Dressing on left lower leg.

## 2015-02-04 NOTE — Progress Notes (Signed)
Palliative Care Update   Consult initiated.   Pt is actively dying.  But slowly, on BIPAP.  Discussed taking pt off of BIPAP and having her on comfort only terminal care.  Family in agreement.  Orders and Full Note in progress. Pt is DNR.  Kari HalterMargaret F Jrue Jarriel, MD

## 2015-02-04 NOTE — ED Notes (Addendum)
Pt came in via EMS w/ SOB. [facility]. Pt lives with son. Son went to wake up pt and found her with hematoma to left head/eye. Pt has been having twitching/seizure activity. Pt presents with AMS and family reports that this is not normal. Pts family sts pt is normally walking with walker and talking. Pts O2 sat was 72 w/ EMS. CBG 169 per EMS. Pt has cardiac history. Pt is DNR.

## 2015-02-04 NOTE — ED Provider Notes (Addendum)
[facility] Emergency Department Provider Note  ____________________________________________  Time seen: Approximately 9:52 AM  I have reviewed the triage vital signs and the nursing notes.   HISTORY  Chief Complaint Altered Mental Status; Fall; and Shortness of Breath  Unable to obtain history due to altered mental status  HPI Kari Smith is a 82 y.o. female who is almost totally blind but usually awake and alert and making sense. Patient walks without difficulty and fell on the ninth was seen at [facility] head CT and neck CT etc. were negative. The patient woke up today she was twitching a little bit and very confused and seemed to have be having difficulty breathing. Patient would not answer questions would not follow commands in the emergency room. Daughter reports that patient was like this one time previously when she was very hypoxic. Patient at that time was admitted for couple days and improved. An having short gasping respirations in the emergency room.  Past Medical History  Diagnosis Date  . COPD (chronic obstructive pulmonary disease) (HCC)   . Emphysema/COPD (HCC)   . Liver cirrhosis (HCC)     due to medications ( REPORTED BY DAUGHTERS)  . Coronary artery disease   . Hypertension   . 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 Anemia   . Peripheral vascular disease [facility])     Patient Active Problem List   Diagnosis Date Noted  . Respiratory failure with hypoxia and hypercapnia (HCC) 2015/02/10    Past Surgical History  Procedure Laterality Date  . Coronary stent placement    . Knee arthroscopy    . Vascular surgery    . Shoulder surgery    . Cataracts      No current outpatient prescriptions on file.  Allergies Penicillins  Family History  Problem Relation Age of Onset  . Stomach cancer Mother     Social History Social History  Substance Use Topics  . Smoking status: Former Games developer  . Smokeless tobacco: None  .  Alcohol Use: No    Review of Systems Unobtainable due to altered mental status  ____________________________________________   PHYSICAL EXAM:  VITAL SIGNS: ED Triage Vitals  Enc Vitals Group     BP Feb 10, 2015 0739 135/73 mmHg     Pulse Rate 02-10-15 0739 101     Resp --      Temp February 10, 2015 0739 98.5 F (36.9 C)     Temp Source Feb 10, 2015 0739 Oral     SpO2 February 10, 2015 0739 65 %     Weight --      Height --      Head Cir --      Peak Flow --      Pain Score --      Pain Loc --      Pain Edu? --      Excl. in GC? --    Constitutional: Minimally responsive in respiratory distress with Eyes: Conjunctivae are normal. PERRL. EOMI. Head: One daughter rise patient reports she reports the patient has no new bruising there is a large bruise on the left side of the face around the eye and forehead Nose: No congestion/rhinnorhea. Mouth/Throat: Mucous membranes are moist.  Oropharynx non-erythematous. Neck: No stridor.   Cardiovascular: Normal rate, regular rhythm. Grossly normal heart sounds.  Good peripheral circulation. Respiratory: Increased respiratory effort short gasping respirations lungs do not sound congested. All were waiting for chest x-ray ultrasound was done and showed no signs of congestive heart failure  good lung sliding Gastrointestinal: Soft and nontender. No distention. No abdominal bruits. No CVA tenderness. Musculoskeletal: No lower extremity tenderness trace edema.  No joint effusions. Neurologic:  Patient not speaking will moan occasionally some spontaneous movement of the arms and legs but not much patient will not follow commands Skin:  Multiple bruises   ____________________________________________   LABS (all labs ordered are listed, but only abnormal results are displayed)  Labs Reviewed  COMPREHENSIVE METABOLIC PANEL - Abnormal; Notable for the following:    Potassium 5.5 (*)    Chloride 93 (*)    CO2 48 (*)    Glucose, Bld 153 (*)    BUN 54 (*)     Albumin 3.2 (*)    GFR calc non Af Amer 52 (*)    Anion gap 3 (*)    All other components within normal limits  LIPASE, BLOOD - Abnormal; Notable for the following:    Lipase 89 (*)    All other components within normal limits  TROPONIN I - Abnormal; Notable for the following:    Troponin I 0.10 (*)    All other components within normal limits  CBC WITH DIFFERENTIAL/PLATELET - Abnormal; Notable for the following:    WBC 14.8 (*)    RBC 2.46 (*)    Hemoglobin 7.3 (*)    HCT 24.8 (*)    MCV 100.9 (*)    MCHC 29.5 (*)    Neutro Abs 12.0 (*)    Monocytes Absolute 1.4 (*)    Basophils Absolute 0.2 (*)    All other components within normal limits  URINALYSIS COMPLETEWITH MICROSCOPIC ([facility] ONLY) - Abnormal; Notable for the following:    Color, Urine YELLOW (*)    APPearance HAZY (*)    Squamous Epithelial / LPF 0-5 (*)    All other components within normal limits  BLOOD GAS, ARTERIAL - Abnormal; Notable for the following:    pH, Arterial 7.17 (*)    pCO2 arterial 155 (*)    pO2, Arterial 219 (*)    Allens test (pass/fail) POSITIVE (*)    All other components within normal limits  BRAIN NATRIURETIC PEPTIDE - Abnormal; Notable for the following:    B Natriuretic Peptide 983.0 (*)    All other components within normal limits  BLOOD GAS, ARTERIAL - Abnormal; Notable for the following:    pH, Arterial 7.28 (*)    pCO2 arterial 114 (*)    pO2, Arterial 63 (*)    Bicarbonate 53.6 (*)    Acid-Base Excess 24.3 (*)    Allens test (pass/fail) POSITIVE (*)    All other components within normal limits  TROPONIN I - Abnormal; Notable for the following:    Troponin I 0.13 (*)    All other components within normal limits  CULTURE, BLOOD (ROUTINE X 2)  CULTURE, BLOOD (ROUTINE X 2)  URINE CULTURE  MRSA PCR SCREENING  MRSA PCR SCREENING  LACTIC ACID, PLASMA  LACTIC ACID, PLASMA  PROTIME-INR  TROPONIN I   ____________________________________________  EKG  EKG read and interpreted by  me shows normal sinus rhythm rate of 77 normal axis no acute ST-T wave changes EKG #2 done due to poor baseline and EKG #1 shows normal sinus rhythm approximately same rate the right arm right and left arm leads reversed again no acute ST-T wave changes ____________________________________________  RADIOLOGY  Chest x-ray read by radiology is new suprahilar infiltrate ____________________________________________   PROCEDURES    ____________________________________________   INITIAL IMPRESSION / ASSESSMENT AND  PLAN / ED COURSE  Pertinent labs & imaging results that were available during my care of the patient were reviewed by me and considered in my medical decision making (see chart for details).   ____________________________________________   FINAL CLINICAL IMPRESSION(S) / ED DIAGNOSES  Final diagnoses:  Community acquired pneumonia  COPD with acute lower respiratory infection (HCC)  Altered mental status, unspecified altered mental status type  Hypoxia  Hypercarbia  Elevated troponin      Arnaldo Natal, MD 2015-02-14 1543 Critical care time 20 minutes  Arnaldo Natal, MD Feb 14, 2015 [phone]

## 2015-02-04 NOTE — Progress Notes (Signed)
   2015/01/02 1630  Clinical Encounter Type  Visited With Patient and family together  Visit Type Patient actively dying  Referral From Nurse  Consult/Referral To Chaplain  Spiritual Encounters  Spiritual Needs Emotional;Grief support  Paged because patient actively dying. Stayed with family entire time until patient passed away. Offered comforting and compassionate presence. Chaplain Cordelia PocheEmily Shalese Strahan

## 2015-02-04 NOTE — Progress Notes (Signed)
Death Pronouncement   I was present in room when pt went into PEA with a rate of around 30 or less seen on monitor. She had agonal(and minimal)  respiratory movements of her mouth a few times-- but no air movement or pulses were noted at the time of death.  Her pupils were fixed and dilated and she had no heart or lung sounds and no respirations or pulses and is pronounced dead at 5:47 pm.  She had at first become quite hypoxic once we took her off of BIPAP.  Very low doses of morphine and Ativan had been given for comfort as she progressed in the dying process.    Family was bedside and death had been expected. Chaplain was in the room. Family was glad that pt is no longer suffering and they were glad they 'let her go' instead of continuing aggressive care.  Death certificate to be completed by attending.   Kari HalterMargaret F Abyan Cadman,  MD

## 2015-02-04 NOTE — Discharge Summary (Signed)
[facility] [place] [facility]   PATIENT NAME: Kari Smith    MR#:  [id number]  DATE OF BIRTH:  Jun 08, 1927  DATE OF ADMISSION:  02-10-2015 ADMITTING PHYSICIAN: Alford Highland, MD  DATE OF DEATH: 2015-02-10 at 17:47  PRIMARY CARE PHYSICIAN: Dr. Marcello Fennel   ADMISSION DIAGNOSIS:  Hypercarbia [R06.89] Syncope [R55] Community acquired pneumonia [J18.9] Hypoxia [R09.02] Elevated troponin [R79.89] Altered mental status, unspecified altered mental status type [R41.82] COPD with acute lower respiratory infection (HCC) [J44.0]   SECONDARY DIAGNOSIS:   Past Medical History  Diagnosis Date  . COPD (chronic obstructive pulmonary disease) (HCC)   . Emphysema/COPD (HCC)   . Liver cirrhosis (HCC)     due to medications ( REPORTED BY DAUGHTERS)  . Coronary artery disease   . Hypertension   . 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 Anemia   . Peripheral vascular disease [facility])     HOSPITAL COURSE:   Please see admission note from the same day. Patient was seen in consultation by critical care specialist Dr. Belia Heman Patient was seen in consultation by palliative care team and the patient was made comfort care. The patient expired at 17:47 under comfort care measures.  Patient was admitted with acute on chronic hypercarbic respiratory failure, COPD exacerbation and possible pneumonia of the right lung. The patient also had acute encephalopathy from CO2 retention.   CONSULTS OBTAINED:  Treatment Team:  Erin Fulling, MD  DRUG ALLERGIES:   Allergies  Allergen Reactions  . Penicillins Other (See Comments)    UNKNOWN  Has patient had a PCN reaction causing immediate rash, facial/tongue/throat swelling, SOB or lightheadedness with hypotension: unknown Has patient had a PCN reaction causing severe rash involving mucus membranes or skin necrosis: unknown Has patient had a PCN reaction that required hospitalization unknown Has patient had a PCN reaction  occurring within the last 10 years: unknown If all of the above answers are "NO", then may proceed with Cephalosporin use.    DATA REVIEW:   CBC  Recent Labs Lab 02/10/2015 0745  WBC 14.8*  HGB 7.3*  HCT 24.8*  PLT 224    Chemistries   Recent Labs Lab Feb 10, 2015 0745  NA 144  K 5.5*  CL 93*  CO2 48*  GLUCOSE 153*  BUN 54*  CREATININE 0.96  CALCIUM 9.6  AST 32  ALT 17  ALKPHOS 67  BILITOT 0.7    Cardiac Enzymes  Recent Labs Lab Feb 10, 2015 1313  TROPONINI 0.13*    Microbiology Results  Results for orders placed or performed during the hospital encounter of 02-10-2015  Culture, blood (routine x 2)     Status: None (Preliminary result)   Collection Time: 10-Feb-2015  8:52 AM  Result Value Ref Range Status   Specimen Description BLOOD RIGHT IV  Final   Special Requests BOTTLES DRAWN AEROBIC AND ANAEROBIC  1CC  Final   Culture NO GROWTH < 12 HOURS  Final   Report Status PENDING  Incomplete  Culture, blood (routine x 2)     Status: None (Preliminary result)   Collection Time: 02-10-15  9:05 AM  Result Value Ref Range Status   Specimen Description BLOOD LEFT  IV  Final   Special Requests BOTTLES DRAWN AEROBIC AND ANAEROBIC  1CC  Final   Culture NO GROWTH < 12 HOURS  Final   Report Status PENDING  Incomplete    RADIOLOGY:  Ct Head Wo Contrast  Feb 10, 2015  CLINICAL DATA:  Altered mental status and weakness  since falling 3 days ago. Left forehead swelling. EXAM: CT HEAD WITHOUT CONTRAST TECHNIQUE: Contiguous axial images were obtained from the base of the skull through the vertex without intravenous contrast. COMPARISON:  Limited correlation made with neck CTA 01/14/2011. FINDINGS: There is no evidence of acute intracranial hemorrhage, mass lesion, brain edema or extra-axial fluid collection. The ventricles and subarachnoid spaces are appropriately sized for age. There is no CT evidence of acute cortical infarction. There are chronic small vessel ischemic changes within the  periventricular white matter and basal ganglia. Intracranial vascular calcifications noted. There is a left frontal scalp soft tissue hematoma. The mastoid air cells on the right are under pneumatized and partially opacified, similar to prior study. The visualized paranasal sinuses, mastoid air cells and middle ears are otherwise clear. The calvarium is intact. IMPRESSION: 1. No acute intracranial findings. 2. Chronic small vessel ischemic changes in the periventricular white matter and left frontal scalp hematoma noted. Electronically Signed   By: Carey BullocksWilliam  Veazey M.D.   On: 25-Mar-2014 09:45   Dg Chest Port 1 View  25-Mar-2014  CLINICAL DATA:  Syncope, history of COPD, cirrhosis, and coronary artery disease. EXAM: PORTABLE CHEST 1 VIEW COMPARISON:  Portable chest x-ray of November 08, 2013. FINDINGS: There is a new right suprahilar density. The interstitial markings elsewhere in both lungs remain increased but are stable. The hemidiaphragm on the left is better demonstrated today. The cardiac silhouette is enlarged. The pulmonary vascularity is not clearly engorged. There is calcification wall of the aortic arch. The observed bony thorax is unremarkable. IMPRESSION: New right suprahilar density. This may reflect pneumonia but neoplasm is not excluded. If the patient does not have clinical findings suspicious for pneumonia, CT scanning of the chest now is recommended. If there are clinical findings suggestive of pneumonia, followup PA and lateral chest X-ray is recommended in 3-4 weeks following trial of antibiotic therapy to ensure resolution and exclude underlying malignancy. Electronically Signed   By: David  SwazilandJordan M.D.   On: 25-Mar-2014 07:59    CODE STATUS:     Code Status Orders        Start     Ordered   03-13-2014 1646  DNR (Do not attempt resuscitation)   Continuous    Question Answer Comment  In the event of cardiac or respiratory ARREST Do not call a "code blue"   In the event of cardiac or  respiratory ARREST Do not perform Intubation, CPR, defibrillation or ACLS   In the event of cardiac or respiratory ARREST Use medication by any route, position, wound care, and other measures to relive pain and suffering. May use oxygen, suction and manual treatment of airway obstruction as needed for comfort.      03-13-2014 1645      TOTAL TIME TAKING CARE OF THIS PATIENT: 50 minutes earlier today with the history and physical. Case discussed with palliative care team at the time of death.   Alford HighlandWIETING, Jamorian Dimaria M.D on 25-Mar-2014 at 6:16 PM  Between 7am to 6pm - Pager - [phone]  After 6pm go to [email or web address] - password [email or web address] [facility]  [facility]  Office  [phone]  CC: Primary care physician; Dr. Marcello FennelHande

## 2015-02-04 NOTE — Consult Note (Signed)
Palliative Medicine Inpatient Consult Note   Name: Kari Smith Date: 02/04/14 MRN: [id number]  DOB: 1927/11/16  Referring Physician: Alford Highlandichard Wieting, MD  Palliative Care consult requested for this 82 y.o. female for goals of medical therapy in patient with acute on chronic hypoxic respiratory failure.    TODAY'S DISCUSSIONS AND DECISIONS: 1.  Pt is unable to make her own decisions.  Three of her four adult children are here (one lives in [place] and cannot come immediately).  Other relatives are present as well.  The adult children have opted to make pt terminal comfort care after we discussed how she is not improving and is actually showing signs of the dying process.  I have reviewed the medical record and agree that her lifespan is   2.  Pt is already DNR.  3.  Pt would either need to be intubated or allowed to pass away.  Pt would not want a feeding tube or a ventilator and she has been telling family she has been 'ready to go' for about a year now.  Family also feels that the BIPAP is not comfortable and we are therefore going to take this off and give her low dose morphine and/ or ativan as needed for terminal comfort care.   4.  I provided supportive discussions and presence after we developed the complex plan for terminal comfort care.  I have entered appropriate orders.  See other notes I have entered for today.  IMPRESSION: 1. Acute on chronic hypoxic and hypercarbic respiratory failure --due to COPD and  POSSIBLE pneumonia right lung ---h/o tobacco smoking 2.  Metabolic Encephalopathy  ---due to CO@ retention 3.  H/O cirrhosis  4.  Anemia of chronic disease 5.  Recent fall --due to illness (illness is NOT due to fall but the other way around).   ----resulted in bruising but not obvious trauma intracranially on CT ---left scalp hematoma is noted 6.  HTN 7.  PVD 8.  Vision and hearing difficulties      REVIEW OF SYSTEMS:  Patient is not able to provide ROS due  to being unresponsive   SPIRITUAL SUPPORT SYSTEM: Yes  -family (and chaplain is in the room).  SOCIAL HISTORY:  reports that she has quit smoking. She does not have any smokeless tobacco history on file. She reports that she does not drink alcohol or use illicit drugs.  LEGAL DOCUMENTS:  None   CODE STATUS: DNR  PAST MEDICAL HISTORY: Past Medical History  Diagnosis Date  . COPD (chronic obstructive pulmonary disease) (HCC)   . Emphysema/COPD (HCC)   . Liver cirrhosis (HCC)     due to medications ( REPORTED BY DAUGHTERS)  . Coronary artery disease   . Hypertension   . 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 Anemia   . Peripheral vascular disease (HCC)     PAST SURGICAL HISTORY:  Past Surgical History  Procedure Laterality Date  . Coronary stent placement    . Knee arthroscopy    . Vascular surgery    . Shoulder surgery    . Cataracts      ALLERGIES:  is allergic to penicillins.  MEDICATIONS:  Current Facility-Administered Medications  Medication Dose Route Frequency Provider Last Rate Last Dose  . acetaminophen (TYLENOL) tablet 650 mg  650 mg Oral Q6H PRN Alford Highlandichard Wieting, MD       Or  . acetaminophen (TYLENOL) suppository 650 mg  650 mg Rectal Q6H PRN Alford Highlandichard Wieting, MD      .  budesonide (PULMICORT) nebulizer solution 0.25 mg  0.25 mg Nebulization BID Alford Highland, MD   0.25 mg at 01/27/15 1045  . enoxaparin (LOVENOX) injection 40 mg  40 mg Subcutaneous Q24H Alford Highland, MD   40 mg at 01/27/15 1456  . ipratropium-albuterol (DUONEB) 0.5-2.5 (3) MG/3ML nebulizer solution 3 mL  3 mL Nebulization Q6H Alford Highland, MD   3 mL at January 27, 2015 1547  . [START ON 01/17/2015] levofloxacin (LEVAQUIN) IVPB 750 mg  750 mg Intravenous Q48H Alford Highland, MD      . methylPREDNISolone sodium succinate (SOLU-MEDROL) 125 mg/2 mL injection 60 mg  60 mg Intravenous Q6H Alford Highland, MD   60 mg at 01/27/15 1208  . morphine 2 MG/ML injection 2-4 mg  2-4 mg Intravenous Q15 min PRN  Erin Fulling, MD      . pantoprazole (PROTONIX) injection 40 mg  40 mg Intravenous Q24H Erin Fulling, MD   40 mg at 01/27/15 1456    Vital Signs: BP 110/49 mmHg  Pulse 67  Temp(Src) 97.2 F (36.2 C) (Axillary)  Resp 29  Ht  (1.651 m)  Wt 70.761 kg (156 lb)  BMI 25.96 kg/m2  SpO2 97% Filed Weights   27-Jan-2015 0800 01/27/2015 1300  Weight: 74 kg (163 lb 2.3 oz) 70.761 kg (156 lb)    Estimated body mass index is 25.96 kg/(m^2) as calculated from the following:   Height as of this encounter:  (1.651 m).   Weight as of this encounter: 70.761 kg (156 lb).  PERFORMANCE STATUS (ECOG) : 4 - Bedbound  PHYSICAL EXAM: Pt with bruised face on BIPAP and unresponsive with poor oxygen sats adn apparent physical distress  Eyes are partly open at first --later closed  Nares patent OP clear anteriorly Neck w/o JVD or TM Hrt rrr no m hears Lungs with ronchi and decreased BS bases Abd soft and NT Skin no mottling as yet  LABS: CBC:    Component Value Date/Time   WBC 14.8* Jan 27, 2015 0745   WBC 5.1 11/15/2013 0401   HGB 7.3* 01-27-2015 0745   HGB 6.7* 11/15/2013 0401   HCT 24.8* 01-27-2015 0745   HCT 22.6* 11/15/2013 0401   PLT 224 01/27/15 0745   PLT 82* 11/15/2013 0401   MCV 100.9* 27-Jan-2015 0745   MCV 98 11/15/2013 0401   NEUTROABS 12.0* 27-Jan-2015 0745   NEUTROABS 3.8 11/15/2013 0401   LYMPHSABS 1.2 27-Jan-2015 0745   LYMPHSABS 0.6* 11/15/2013 0401   MONOABS 1.4* January 27, 2015 0745   MONOABS 0.6 11/15/2013 0401   EOSABS 0.0 01/27/2015 0745   EOSABS 0.1 11/15/2013 0401   BASOSABS 0.2* Jan 27, 2015 0745   BASOSABS 0.0 11/15/2013 0401   Comprehensive Metabolic Panel:    Component Value Date/Time   NA 144 Jan 27, 2015 0745   NA 143 11/15/2013 0401   K 5.5* 01/27/15 0745   K 3.4* 11/15/2013 0401   CL 93* 01/27/2015 0745   CL 105 11/15/2013 0401   CO2 48* 01/27/2015 0745   CO2 33* 11/15/2013 0401   BUN 54* 2015/01/27 0745   BUN 44* 11/15/2013 0401   CREATININE 0.96  January 27, 2015 0745   CREATININE 1.50* 11/15/2013 0401   GLUCOSE 153* 27-Jan-2015 0745   GLUCOSE 113* 11/15/2013 0401   CALCIUM 9.6 01/27/15 0745   CALCIUM 8.1* 11/15/2013 0401   AST 32 01/27/2015 0745   AST 25 11/08/2013 1625   ALT 17 2015/01/27 0745   ALT 13* 11/08/2013 1625   ALKPHOS 67 01-27-15 0745   ALKPHOS 59 11/08/2013  1625   BILITOT 0.7 02/09/2015 0745   BILITOT 0.3 11/08/2013 1625   PROT 6.6 Feb 09, 2015 0745   PROT 7.1 11/08/2013 1625   ALBUMIN 3.2* 02/09/15 0745   ALBUMIN 3.4 11/08/2013 1625    More than 50% of the visit was spent in counseling/coordination of care: Yes  Time Spent: 110 minutes

## 2015-02-04 NOTE — Progress Notes (Signed)
Pharmacy Consult for Levaquin Indication: rule out pneumonia  Allergies  Allergen Reactions  . Penicillins Other (See Comments)    UNKNOWN  Has patient had a PCN reaction causing immediate rash, facial/tongue/throat swelling, SOB or lightheadedness with hypotension: unknown Has patient had a PCN reaction causing severe rash involving mucus membranes or skin necrosis: unknown Has patient had a PCN reaction that required hospitalization unknown Has patient had a PCN reaction occurring within the last 10 years: unknown If all of the above answers are "NO", then may proceed with Cephalosporin use.    Patient Measurements: Height: 5\' 5"  (165.1 cm) Weight: 156 lb (70.761 kg) IBW/kg (Calculated) : 57   Vital Signs: Temp: 97.2 F (36.2 C) (12/12 1300) Temp Source: Axillary (12/12 1300) BP: 107/66 mmHg (12/12 1300) Pulse Rate: 78 (12/12 1300) Intake/Output from previous day:   Intake/Output from this shift:    Labs:  Recent Labs  01/12/15 1539 02-10-2014 0745  WBC 13.0* 14.8*  HGB 8.5* 7.3*  PLT 234 224  CREATININE 0.67 0.96   Estimated Creatinine Clearance: 42.3 mL/min (by C-G formula based on Cr of 0.96). No results for input(s): VANCOTROUGH, VANCOPEAK, VANCORANDOM, GENTTROUGH, GENTPEAK, GENTRANDOM, TOBRATROUGH, TOBRAPEAK, TOBRARND, AMIKACINPEAK, AMIKACINTROU, AMIKACIN in the last 72 hours.   Microbiology: No results found for this or any previous visit (from the past 720 hour(s)).  Medical History: Past Medical History  Diagnosis Date  . COPD (chronic obstructive pulmonary disease) (HCC)   . Emphysema/COPD (HCC)   . Liver cirrhosis (HCC)     due to medications ( REPORTED BY DAUGHTERS)  . Coronary artery disease   . Hypertension   . 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 Anemia   . Peripheral vascular disease (HCC)     Medications:  Scheduled:  . allopurinol  100 mg Oral Daily  . aspirin EC  81 mg Oral QODAY  . budesonide (PULMICORT) nebulizer solution   0.25 mg Nebulization BID  . enoxaparin (LOVENOX) injection  40 mg Subcutaneous Q24H  . ipratropium-albuterol  3 mL Nebulization Q6H  . [START ON 01/17/2015] levofloxacin (LEVAQUIN) IV  750 mg Intravenous Q48H  . methylPREDNISolone (SOLU-MEDROL) injection  60 mg Intravenous Q6H  . pantoprazole  40 mg Oral Daily   Infusions:    Assessment: 82 y/o F with COPD exacerbation, possible PNA.   Plan:  Levaquin 750 mg iv once given then 500 mg iv daily ordered. Will adjust dosing to Levaquin 750 mg iv q 48 hours as appropriate for indication and renal function.   Luisa Harthristy, Somtochukwu Woollard D 2014/05/05,1:29 PM

## 2015-02-04 NOTE — ED Notes (Signed)
Troponin 0.10. Dr. Darnelle CatalanMalinda aware.

## 2015-02-04 NOTE — H&P (Signed)
[facility] [place] [facility]   PATIENT NAME: Kari Smith    MR#:  [id number]  DATE OF BIRTH:  Oct 13, 1927  DATE OF ADMISSION:  01-23-2015  PRIMARY CARE PHYSICIAN: Dr. Marcello Fennel  REQUESTING/REFERRING PHYSICIAN: Dorothea Glassman  CHIEF COMPLAINT:   Chief Complaint  Patient presents with  . Altered Mental Status  . Fall  . Shortness of Breath    HISTORY OF PRESENT ILLNESS:  Kari Smith  is a 82 y.o. female with a known history of end-stage COPD on 2 L of oxygen, cirrhosis, anemia, peripheral vascular disease, macular degeneration and decreased hearing. She had a fall on Friday and a large bruise on the left forehead and she was over at [facility] and released the same day from the ER. She never rebounded since then as per family. Last night she was hallucinating hollering and twitching. She normally wears 4 L of oxygen chronically. Today she was not responsive. She stated that she felt bad but could not elaborate. In the ER her PCO2 on a blood gas was 114 and hospitalist services were contacted for further evaluation.  PAST MEDICAL HISTORY:   Past Medical History  Diagnosis Date  . COPD (chronic obstructive pulmonary disease) (HCC)   . Emphysema/COPD (HCC)   . Liver cirrhosis (HCC)     due to medications ( REPORTED BY DAUGHTERS)  . Coronary artery disease   . Hypertension   . 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 Anemia   . Peripheral vascular disease (HCC)     PAST SURGICAL HISTORY:   Past Surgical History  Procedure Laterality Date  . Coronary stent placement    . Knee arthroscopy    . Vascular surgery    . Shoulder surgery    . Cataracts      SOCIAL HISTORY:   Social History  Substance Use Topics  . Smoking status: Former Games developer  . Smokeless tobacco: Not on file  . Alcohol Use: No    FAMILY HISTORY:   Family History  Problem Relation Age of Onset  . Stomach cancer Mother     DRUG ALLERGIES:   Allergies  Allergen  Reactions  . Penicillins Other (See Comments)    UNKNOWN  Has patient had a PCN reaction causing immediate rash, facial/tongue/throat swelling, SOB or lightheadedness with hypotension: unknown Has patient had a PCN reaction causing severe rash involving mucus membranes or skin necrosis: unknown Has patient had a PCN reaction that required hospitalization unknown Has patient had a PCN reaction occurring within the last 10 years: unknown If all of the above answers are "NO", then may proceed with Cephalosporin use.    REVIEW OF SYSTEMS:  Unable to obtain secondary to altered mental status  MEDICATIONS AT HOME:   Prior to Admission medications   Medication Sig Start Date End Date Taking? Authorizing Provider  albuterol (PROVENTIL HFA;VENTOLIN HFA) 108 (90 BASE) MCG/ACT inhaler Inhale 1-2 puffs into the lungs every 6 (six) hours as needed for wheezing or shortness of breath.   Yes Historical Provider, MD  albuterol (PROVENTIL) (2.5 MG/3ML) 0.083% nebulizer solution Take 2.5 mg by nebulization every 6 (six) hours as needed for wheezing or shortness of breath.   Yes Historical Provider, MD  albuterol-ipratropium (COMBIVENT) 18-103 MCG/ACT inhaler Inhale 2 puffs into the lungs every 4 (four) hours as needed for wheezing or shortness of breath.   Yes Historical Provider, MD  alendronate (FOSAMAX) 70 MG tablet Take 70 mg by mouth once a  week. Take with a full glass of water on an empty stomach.   Yes Historical Provider, MD  allopurinol (ZYLOPRIM) 100 MG tablet Take 100 mg by mouth daily.   Yes Historical Provider, MD  aspirin EC 81 MG tablet Take 81 mg by mouth every other day.   Yes Historical Provider, MD  atenolol (TENORMIN) 25 MG tablet Take 25 mg by mouth daily.   Yes Historical Provider, MD  azithromycin (ZITHROMAX Z-PAK) 250 MG tablet Day 1 - Take 2 tablets (500 mg), day 2-5 take 1 tablet (250 mg) 01/12/15 01/17/15 Yes Corena HerterShannon Mumma, MD  ferrous sulfate 325 (65 FE) MG tablet Take 325 mg by  mouth 2 (two) times daily with a meal.   Yes Historical Provider, MD  furosemide (LASIX) 80 MG tablet Take 80 mg by mouth daily.   Yes Historical Provider, MD  guaiFENesin (MUCINEX) 600 MG 12 hr tablet Take 600 mg by mouth daily as needed.   Yes Historical Provider, MD  lisinopril (PRINIVIL,ZESTRIL) 20 MG tablet Take 20 mg by mouth daily.   Yes Historical Provider, MD  Omega-3 Fatty Acids (FISH OIL PO) Take 1 capsule by mouth daily.   Yes Historical Provider, MD  omeprazole (PRILOSEC) 20 MG capsule Take 20 mg by mouth 2 (two) times daily before a meal.   Yes Historical Provider, MD  potassium chloride (K-DUR,KLOR-CON) 10 MEQ tablet Take 20 mEq by mouth 2 (two) times daily.    Yes Historical Provider, MD  simvastatin (ZOCOR) 20 MG tablet Take 20 mg by mouth daily.   Yes Historical Provider, MD  Vitamin D, Ergocalciferol, (DRISDOL) 50000 UNITS CAPS capsule Take 50,000 Units by mouth every 7 (seven) days.   Yes Historical Provider, MD      VITAL SIGNS:  Blood pressure 135/62, pulse 75, temperature 98.5 F (36.9 C), temperature source Oral, resp. rate 30, height 5\' 8"  (1.727 m), weight 74 kg (163 lb 2.3 oz), SpO2 100 %.  PHYSICAL EXAMINATION:  GENERAL:  82 y.o.-year-old patient lying in the bed with respiratory acute distress.  EYES: Pupils equal, round, reactive to light and accommodation. No scleral icterus.  HEENT: Large hematoma over left for head and bruising over the left side of the face and neck. Oropharynx and nasopharynx clear.  NECK:  Supple, no jugular venous distention. No thyroid enlargement, no tenderness.  LUNGS: Decreased breath sounds bilaterally, positive wheezing, no rales,rhonchi or crepitation. Positive use of accessory muscles of respiration.  CARDIOVASCULAR: S1, S2 normal. 2/6 systolic murmurs, no rubs, or gallops.  ABDOMEN: Soft, nontender, nondistended. Bowel sounds present. No organomegaly or mass.  EXTREMITIES: 2+ pedal edema, no cyanosis, or clubbing.  NEUROLOGIC:  Difficult to assess with altered mental status PSYCHIATRIC: The patient is lethargic SKIN: Hematoma left for head with bruising left side of the face and into the neck. Chronic lower extremity discoloration with multiple sites of bruising and a skin tear on the left lower extremity.  LABORATORY PANEL:   CBC  Recent Labs Lab 09/14/14 0745  WBC 14.8*  HGB 7.3*  HCT 24.8*  PLT 224   ------------------------------------------------------------------------------------------------------------------  Chemistries   Recent Labs Lab 09/14/14 0745  NA 144  K 5.5*  CL 93*  CO2 48*  GLUCOSE 153*  BUN 54*  CREATININE 0.96  CALCIUM 9.6  AST 32  ALT 17  ALKPHOS 67  BILITOT 0.7   ------------------------------------------------------------------------------------------------------------------  Cardiac Enzymes  Recent Labs Lab 09/14/14 0745  TROPONINI 0.10*   ------------------------------------------------------------------------------------------------------------------  RADIOLOGY:  Ct Head Wo Contrast  08-Aug-2014  CLINICAL DATA:  Altered mental status and weakness since falling 3 days ago. Left forehead swelling. EXAM: CT HEAD WITHOUT CONTRAST TECHNIQUE: Contiguous axial images were obtained from the base of the skull through the vertex without intravenous contrast. COMPARISON:  Limited correlation made with neck CTA 01/14/2011. FINDINGS: There is no evidence of acute intracranial hemorrhage, mass lesion, brain edema or extra-axial fluid collection. The ventricles and subarachnoid spaces are appropriately sized for age. There is no CT evidence of acute cortical infarction. There are chronic small vessel ischemic changes within the periventricular white matter and basal ganglia. Intracranial vascular calcifications noted. There is a left frontal scalp soft tissue hematoma. The mastoid air cells on the right are under pneumatized and partially opacified, similar to prior study. The  visualized paranasal sinuses, mastoid air cells and middle ears are otherwise clear. The calvarium is intact. IMPRESSION: 1. No acute intracranial findings. 2. Chronic small vessel ischemic changes in the periventricular white matter and left frontal scalp hematoma noted. Electronically Signed   By: Carey Bullocks M.D.   On: Jan 21, 2015 09:45   Dg Chest Port 1 View  01/21/2015  CLINICAL DATA:  Syncope, history of COPD, cirrhosis, and coronary artery disease. EXAM: PORTABLE CHEST 1 VIEW COMPARISON:  Portable chest x-ray of November 08, 2013. FINDINGS: There is a new right suprahilar density. The interstitial markings elsewhere in both lungs remain increased but are stable. The hemidiaphragm on the left is better demonstrated today. The cardiac silhouette is enlarged. The pulmonary vascularity is not clearly engorged. There is calcification wall of the aortic arch. The observed bony thorax is unremarkable. IMPRESSION: New right suprahilar density. This may reflect pneumonia but neoplasm is not excluded. If the patient does not have clinical findings suspicious for pneumonia, CT scanning of the chest now is recommended. If there are clinical findings suggestive of pneumonia, followup PA and lateral chest X-ray is recommended in 3-4 weeks following trial of antibiotic therapy to ensure resolution and exclude underlying malignancy. Electronically Signed   By: David  [place] M.D.   On: Jan 21, 2015 07:59    EKG:   Normal sinus rhythm 77 bpm with PVCs  IMPRESSION AND PLAN:   1. Acute on chronic hypercarbic and hypoxic respiratory failure. The patient was placed on BiPAP for PCO2 of 114. We'll recheck an ABG and see if the BiPAP is working. Case discussed with Dr. Belia Heman critical care specialist to see in consultation. We'll have to admit to the CCU since the patient is on BiPAP 60% FiO2. The patient is a DO NOT RESUSCITATE and overall prognosis is poor. Would likely be a candidate for the Trilogy machine as  outpatient at night. 2. COPD exacerbation, possible pneumonia right lung. We'll start IV Solu-Medrol 60 mg every 6 hours DuoNeb and budesonide nebulizers and Levaquin. I'm not sure if this is a pneumonia but since the patient is very sick I will go ahead and treat. 3. Acute encephalopathy- likely secondary to CO2 retention 4. History of cirrhosis- check ammonia level also 5. History of peripheral vascular disease 6. Anemia- her hemoglobin is a little bit lower today than what it has been. She will likely end up needing a transfusion during this hospital stay. I will also check a B12 and folate level since the patient's MCV is high 7. Recent head trauma with hematoma- CT scan is negative for acute event  All the records are reviewed and case discussed with ED provider. Management plans discussed with the patient, family and they are in agreement.  CODE STATUS: DO NOT RESUSCITATE  TOTAL TIME TAKING CARE OF THIS PATIENT: 50 minutes of critical care time. Overall prognosis is poor.   Alford Highland M.D on 01/27/15 at 12:53 PM  Between 7am to 6pm - Pager - [phone]  After 6pm call admission pager [phone]  [facility]  Office  [phone]  CC: Primary care physician; PROVIDER NOT IN SYSTEM

## 2015-02-04 DEATH — deceased

## 2016-06-24 IMAGING — CT CT HEAD W/O CM
2 of 3 series · 16 of 30 positions shown, 18 images · non-contrast
Comparison: Limited correlation made with neck CTA 01/14/2011.

CLINICAL DATA: Altered mental status and weakness since falling 3
days ago. Left forehead swelling.

EXAM:
CT HEAD WITHOUT CONTRAST
TECHNIQUE: Contiguous axial images were obtained from the base of the skull
through the vertex without intravenous contrast.

[Series 2: soft tissue · axial · 0.42mm/px · z∈[+442,+552]mm · 8 of 30 slices shown, 10 images]
[im 4/30  brain]
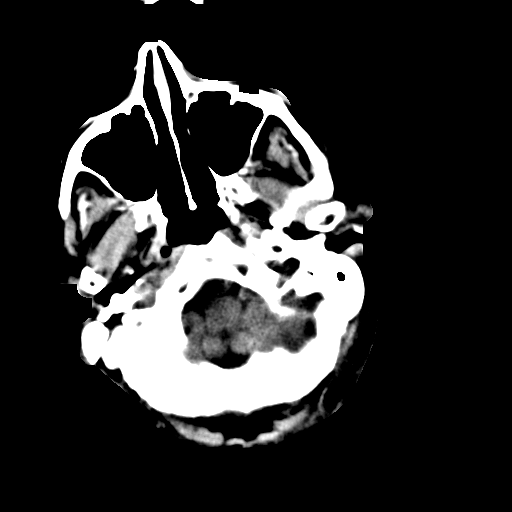
[im 4/30  bone]
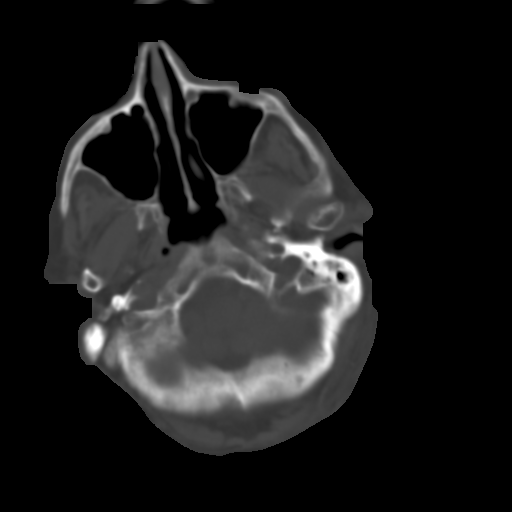
[im 7/30  brain]
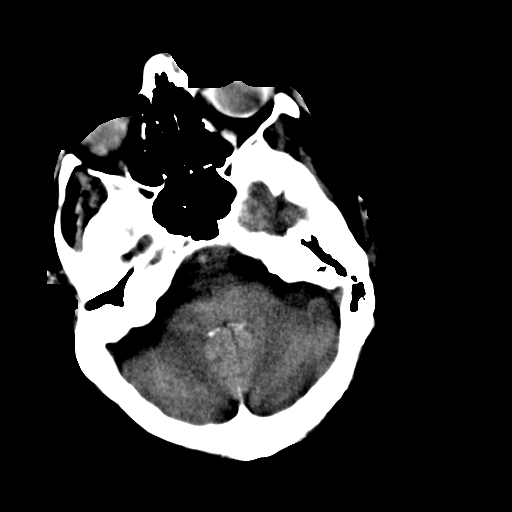
[im 10/30  brain]
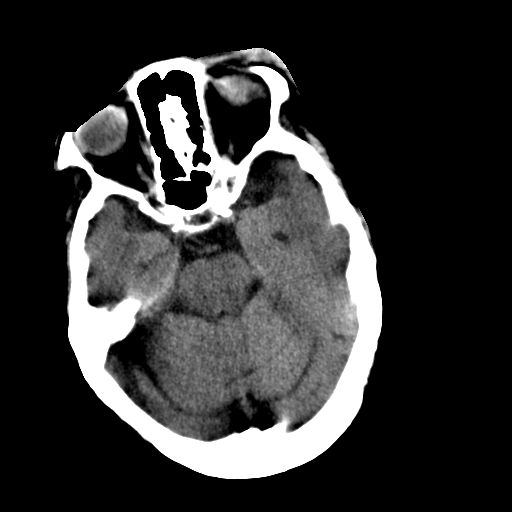
[im 13/30  brain]
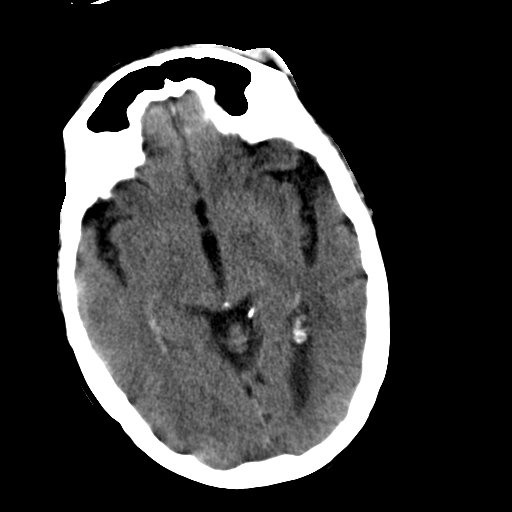
[im 17/30  brain]
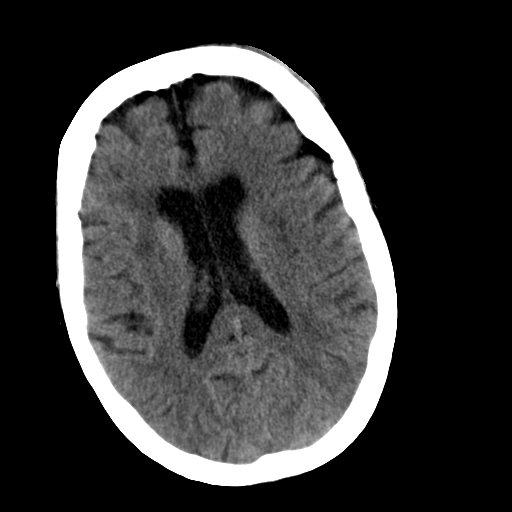
[im 17/30  bone]
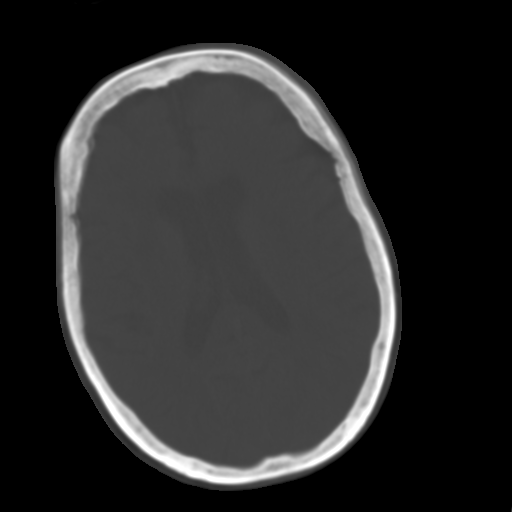
[im 20/30  brain]
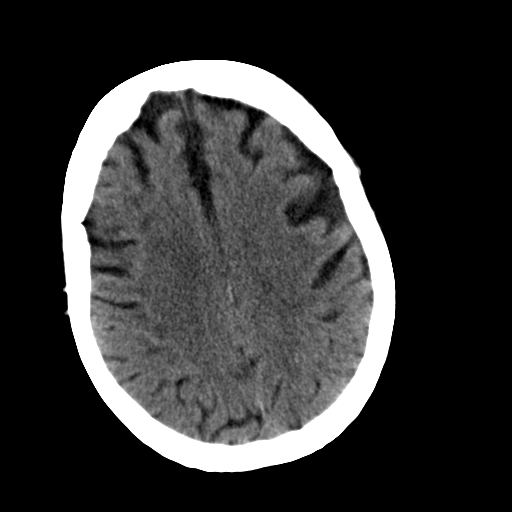
[im 23/30  brain]
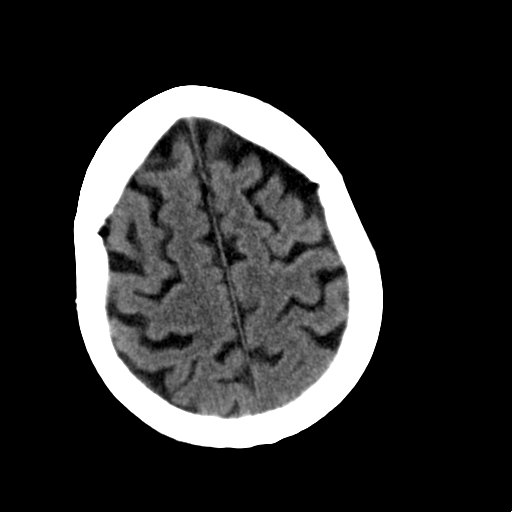
[im 26/30  brain]
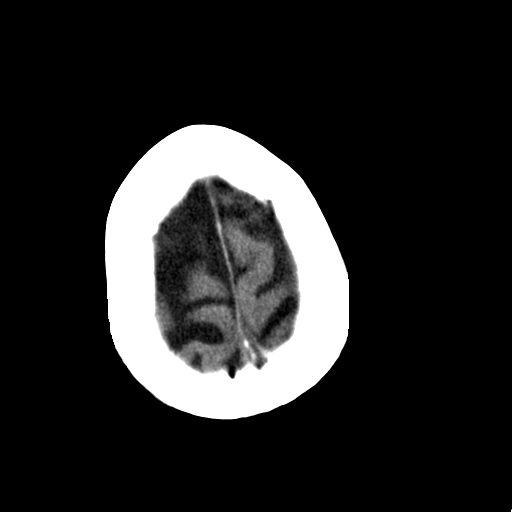

[Series 4: soft tissue recon · axial · 0.42mm/px · z∈[+489,+597]mm · 8 of 31 slices shown]
[im 4/31  brain]
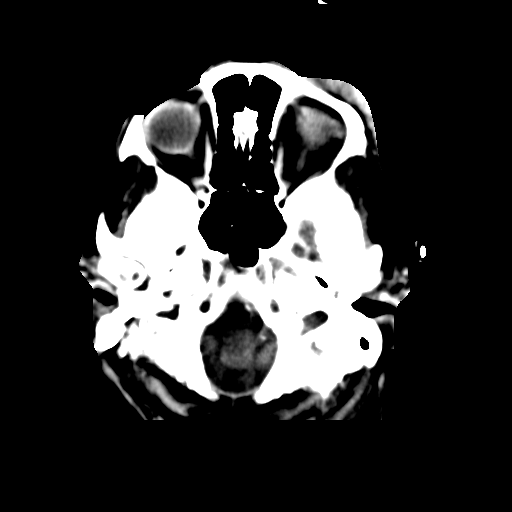
[im 7/31  brain]
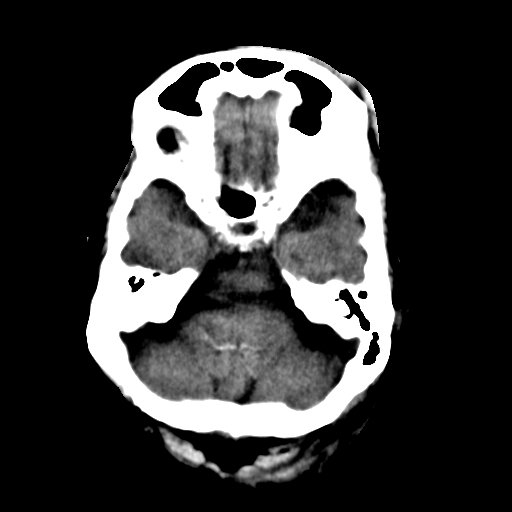
[im 11/31  brain]
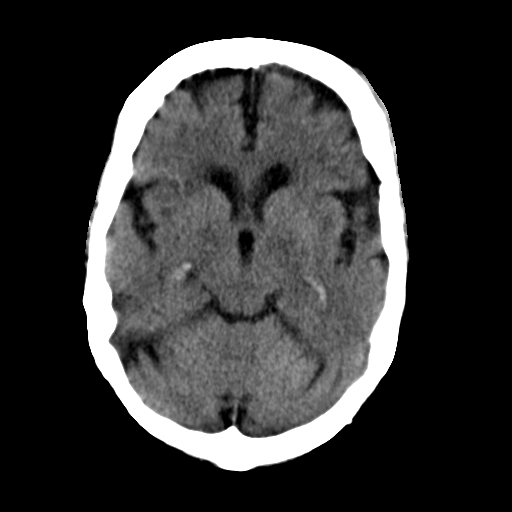
[im 14/31  brain]
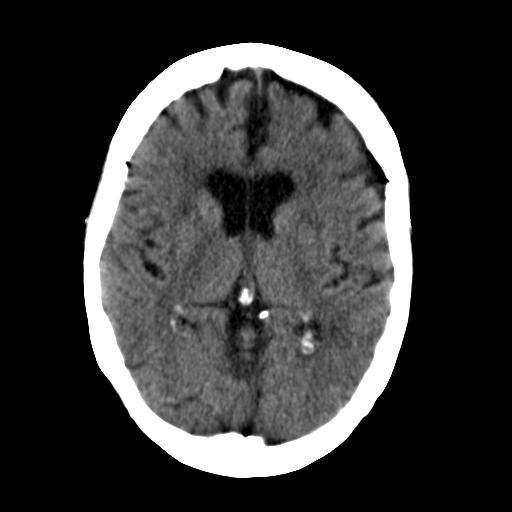
[im 17/31  brain]
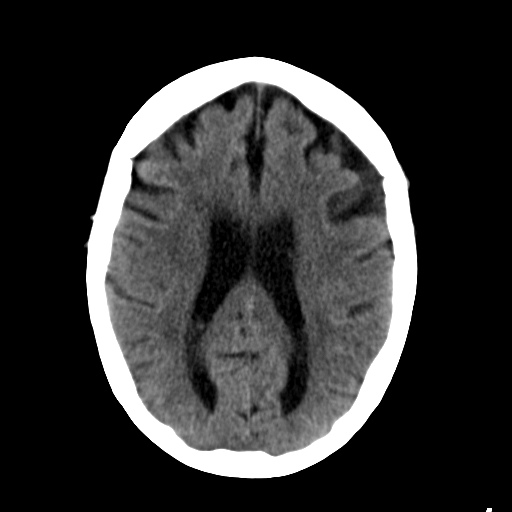
[im 21/31  brain]
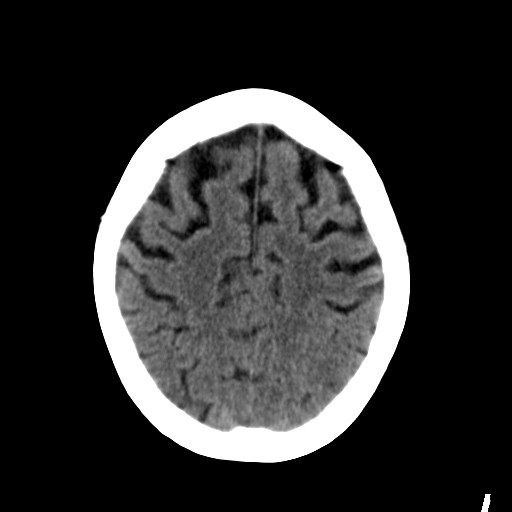
[im 24/31  brain]
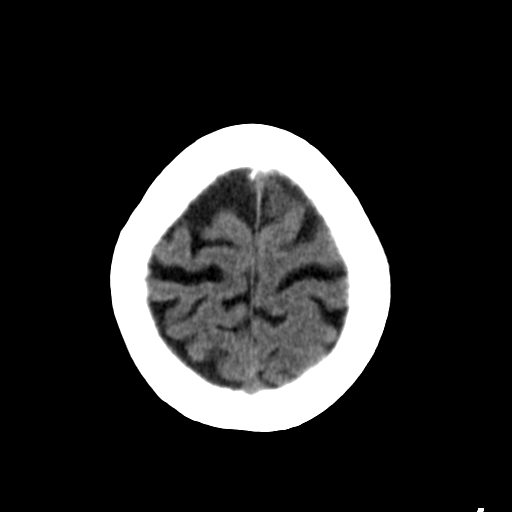
[im 27/31  brain]
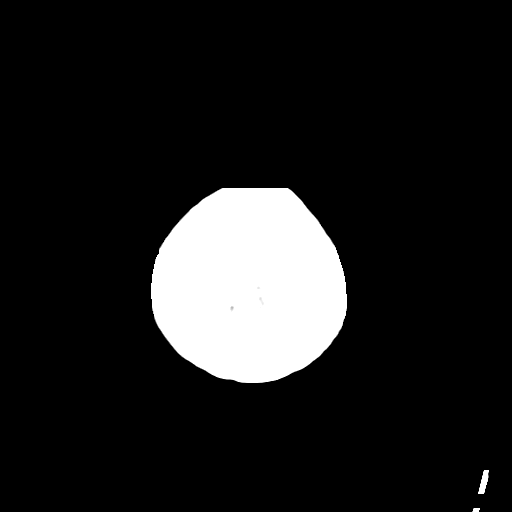

[16 of 30 positions shown; findings below may reference images not displayed]

FINDINGS: There is no evidence of acute intracranial hemorrhage, mass lesion,
brain edema or extra-axial fluid collection. The ventricles and
subarachnoid spaces are appropriately sized for age. There is no CT
evidence of acute cortical infarction. There are chronic small
vessel ischemic changes within the periventricular white matter and
basal ganglia. Intracranial vascular calcifications noted.

There is a left frontal scalp soft tissue hematoma. The mastoid air
cells on the right are under pneumatized and partially opacified,
similar to prior study. The visualized paranasal sinuses, mastoid
air cells and middle ears are otherwise clear. The calvarium is
intact.
IMPRESSION: 1. No acute intracranial findings.
2. Chronic small vessel ischemic changes in the periventricular
white matter and left frontal scalp hematoma noted.
